# Patient Record
Sex: Female | Born: 1995 | Race: White | Hispanic: No | Marital: Married | Smoking: Never smoker
Health system: Southern US, Community
[De-identification: ages and names within clinical notes are randomized; demographics above are authoritative.]

## PROBLEM LIST (undated history)

## (undated) DIAGNOSIS — J45909 Unspecified asthma, uncomplicated: Secondary | ICD-10-CM

## (undated) DIAGNOSIS — A749 Chlamydial infection, unspecified: Secondary | ICD-10-CM

## (undated) HISTORY — PX: WISDOM TOOTH EXTRACTION: SHX21

## (undated) HISTORY — PX: NO PAST SURGERIES: SHX2092

---
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
## 2019-08-02 ENCOUNTER — Emergency Department
Admission: EM | Admit: 2019-08-02 | Discharge: 2019-08-02 | Disposition: A | Discharge status: Home / Self Care | Payer: Medicaid Other | Attending: Emergency Medicine | Admitting: Emergency Medicine

## 2019-08-02 ENCOUNTER — Other Ambulatory Visit: Payer: Self-pay

## 2019-08-02 ENCOUNTER — Encounter: Payer: Self-pay | Admitting: Emergency Medicine

## 2019-08-02 DIAGNOSIS — S3992XA Unspecified injury of lower back, initial encounter: Secondary | ICD-10-CM | POA: Diagnosis present

## 2019-08-02 DIAGNOSIS — Y999 Unspecified external cause status: Secondary | ICD-10-CM | POA: Diagnosis not present

## 2019-08-02 DIAGNOSIS — S39012A Strain of muscle, fascia and tendon of lower back, initial encounter: Secondary | ICD-10-CM | POA: Diagnosis not present

## 2019-08-02 DIAGNOSIS — Y93I9 Activity, other involving external motion: Secondary | ICD-10-CM | POA: Diagnosis not present

## 2019-08-02 DIAGNOSIS — J45909 Unspecified asthma, uncomplicated: Secondary | ICD-10-CM | POA: Insufficient documentation

## 2019-08-02 DIAGNOSIS — Y92410 Unspecified street and highway as the place of occurrence of the external cause: Secondary | ICD-10-CM | POA: Diagnosis not present

## 2019-08-02 LAB — POCT PREGNANCY, URINE: Preg Test, Ur: NEGATIVE

## 2019-08-02 MED ORDER — CYCLOBENZAPRINE HCL 5 MG PO TABS: 5.0000 mg | ORAL_TABLET | Freq: Three times a day (TID) | ORAL | 0 refills | Status: DC | PRN

## 2019-08-02 MED ORDER — IBUPROFEN 800 MG PO TABS: 800.0000 mg | ORAL_TABLET | Freq: Three times a day (TID) | ORAL | 0 refills | Status: DC | PRN
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
## 2020-02-12 ENCOUNTER — Encounter: Payer: Self-pay | Admitting: General Practice

## 2020-03-08 NOTE — L&D Delivery Note (Addendum)
Delivery Note At 8:09 AM a viable and healthy female was delivered via  (Presentation:  OA ).  APGAR: 9, 9. Baby was delivered without complication. Placenta was intact with three vessel cord.    Placenta status: Spontaneous, Intact. Cord pH: N/A  Anesthesia:  N/A Episiotomy: None Lacerations:  R labial repaired, bilateral labial tears. Suture Repair:  4.0 Est. Blood Loss (mL):  100  Mom to postpartum.  Baby to Couplet care / Skin to Skin.  Alfredo Martinez, MD, PGY1 09/13/2020, 8:31 AM   Attestation: I confirm that I have verified the information documented in the resident's note and that I have also personally reperformed the physical exam and all medical decision making activities.   I was gloved and present for entire delivery SVD without incident No difficulty with shoulders Lacerations as listed above Repair of same done by me Aviva Signs, CNM

## 2020-03-12 ENCOUNTER — Other Ambulatory Visit: Payer: Self-pay

## 2020-03-12 ENCOUNTER — Encounter: Payer: Self-pay | Admitting: Advanced Practice Midwife

## 2020-03-12 ENCOUNTER — Ambulatory Visit (INDEPENDENT_AMBULATORY_CARE_PROVIDER_SITE_OTHER): Payer: Medicaid Other | Admitting: Advanced Practice Midwife

## 2020-03-12 ENCOUNTER — Other Ambulatory Visit (HOSPITAL_COMMUNITY)
Admission: RE | Admit: 2020-03-12 | Discharge: 2020-03-12 | Disposition: A | Discharge status: Home / Self Care | Payer: Medicaid Other | Source: Ambulatory Visit | Attending: Advanced Practice Midwife | Admitting: Advanced Practice Midwife

## 2020-03-12 VITALS — BP 112/78 | HR 98 | Wt 205.0 lb

## 2020-03-12 DIAGNOSIS — Z3480 Encounter for supervision of other normal pregnancy, unspecified trimester: Secondary | ICD-10-CM | POA: Insufficient documentation

## 2020-03-12 DIAGNOSIS — Z23 Encounter for immunization: Secondary | ICD-10-CM | POA: Diagnosis not present

## 2020-03-12 DIAGNOSIS — Z3A11 11 weeks gestation of pregnancy: Secondary | ICD-10-CM | POA: Diagnosis not present

## 2020-03-12 DIAGNOSIS — O0990 Supervision of high risk pregnancy, unspecified, unspecified trimester: Secondary | ICD-10-CM | POA: Insufficient documentation

## 2020-03-12 MED ORDER — VITAFOL GUMMIES 3.33-0.333-34.8 MG PO CHEW: 1.0000 | CHEWABLE_TABLET | Freq: Every day | ORAL | 5 refills | Status: DC

## 2020-03-12 NOTE — Progress Notes (Signed)
Subjective:   Sara Wilkinson is a 25 y.o. G2P1001 at 103w6d by LMP being seen today for her first obstetrical visit.  Her obstetrical history is significant for none. Patient does intend to breast feed. Breastfed first baby 2.5 years. Pregnancy history fully reviewed.  Patient reports no complaints.  HISTORY: OB History  Gravida Para Term Preterm AB Living  2 1 1  0 0 1  SAB IAB Ectopic Multiple Live Births  0 0 0 0 1    # Outcome Date GA Lbr Len/2nd Weight Sex Delivery Anes PTL Lv  2 Current           1 Term 05/02/15 [redacted]w[redacted]d 70:31 / 00:44 6 lb 8.1 oz (2.951 kg) F Vag-Spont EPI  LIV     Name: Sara Wilkinson     Apgar1: 9  Apgar5: 9    Last pap smear was done 2016 and was normal  Past Medical History:  Diagnosis Date  . Asthma childhood   Pt has used inhaler in past/ cannot recall  . Asthma childhood   Pt still keeps inhaler available  . Asthma childhood   In case needed  . Asthma childhood   Childhood asthma  . Chlamydia    Past Surgical History:  Procedure Laterality Date  . NO PAST SURGERIES     History reviewed. No pertinent family history. Social History   Tobacco Use  . Smoking status: Never Smoker  . Smokeless tobacco: Never Used  Substance Use Topics  . Alcohol use: No  . Drug use: No   Allergies  Allergen Reactions  . Hydrocodone Hives and Other (See Comments)    Gets the "shakes"   Current Outpatient Medications on File Prior to Visit  Medication Sig Dispense Refill  . Prenatal Vit-Fe Fumarate-FA (PRENATAL MULTIVITAMIN) TABS tablet Take 1 tablet by mouth daily at 12 noon.    . cyclobenzaprine (FLEXERIL) 5 MG tablet Take 1 tablet (5 mg total) by mouth 3 (three) times daily as needed. (Patient not taking: Reported on 03/12/2020) 15 tablet 0  . ibuprofen (ADVIL) 800 MG tablet Take 1 tablet (800 mg total) by mouth every 8 (eight) hours as needed. (Patient not taking: Reported on 03/12/2020) 30 tablet 0   No current facility-administered medications on file  prior to visit.    Review of Systems Pertinent items noted in HPI and remainder of comprehensive ROS otherwise negative.  Exam   Vitals:   03/12/20 1330  BP: 112/78  Pulse: 98  Weight: 205 lb (93 kg)      Uterus:     Pelvic Exam: Perineum: no hemorrhoids, normal perineum   Vulva: normal external genitalia, no lesions   Vagina:  normal mucosa, normal discharge   Cervix: no lesions and normal, pap smear done.    Adnexa: normal adnexa and no mass, fullness, tenderness   Bony Pelvis: average  System: General: well-developed, well-nourished female in no acute distress   Breast:     Skin: normal coloration and turgor, no rashes   Neurologic: oriented, normal, negative, normal mood   Extremities: normal strength, tone, and muscle mass, ROM of all joints is normal   HEENT PERRLA, extraocular movement intact and sclera clear, anicteric   Mouth/Teeth mucous membranes moist, pharynx normal without lesions and dental hygiene good   Neck supple and no masses   Cardiovascular: regular rate and rhythm   Respiratory:  no respiratory distress, normal breath sounds   Abdomen: soft, non-tender; bowel sounds normal; no masses,  no organomegaly  Pt informed that the ultrasound is considered a limited OB ultrasound and is not intended to be a complete ultrasound exam.  Patient also informed that the ultrasound is not being completed with the intent of assessing for fetal or placental anomalies or any pelvic abnormalities.  Explained that the purpose of today's ultrasound is to assess for  viability.  Patient acknowledges the purpose of the exam and the limitations of the study.    + FCA 180 BPM with doppler  Assessment:   Pregnancy: G2P1001 Patient Active Problem List   Diagnosis Date Noted  . Supervision of other normal pregnancy, antepartum 03/12/2020  . Normal labor and delivery 05/02/2015  . NVD (normal vaginal delivery) 05/02/2015     Plan:  1. Supervision of other normal pregnancy,  antepartum - Genetic Screening - CBC/D/Plt+RPR+Rh+ABO+Rub Ab... - Culture, OB Urine - Cytology - PAP - HgB A1c - [place] MFM OB COMP + 14 WK; Future  2. [redacted] weeks gestation of pregnancy - hasn't had covid vaccine. Discussed recommended in pregnancy. Patient will get this done.   Initial labs drawn. Continue prenatal vitamins. Genetic Screening discussed, NIPS: requested. Ultrasound discussed; fetal anatomic survey: requested. Problem list reviewed and updated. The nature of [facility] with multiple MDs and other Advanced Practice Providers was explained to patient; also emphasized that residents, students are part of our team. Routine obstetric precautions reviewed. 50% of 45 min visit spent in counseling and coordination of care. Return in about 4 weeks (around 04/09/2020) for virtual visit .   Thressa Sheller DNP, CNM  03/12/20  1:45 PM

## 2020-03-12 NOTE — Patient Instructions (Signed)
COVID-19 Vaccination if You Are Pregnant or Breastfeeding ° °The Society for Maternal-Fetal Medicine (SMFM) and other pregnancy experts recommend that pregnant and lactating people be vaccinated against COVID-19. The Centers for Disease Control and Prevention (CDC) also recommend vaccination for “all people aged 24 years and older, including people who are pregnant, breastfeeding, trying to get pregnant now, or might become pregnant in the future.” Vaccination is the best way to reduce the risks of COVID-19 infection and COVID-related complications for both you and your baby. ° °Three vaccines are available to prevent COVID-19: °• The two-dose Pfizer vaccine for people 12 years and older--APPROVED by the [place] Food and Drug Administration on October 29, 2019 °• The two-dose Moderna vaccine for people 18 years and older--AUTHORIZED for emergency use °• The one-dose Johnson & Johnson vaccine for people 18 years and older (you may also see this vaccine referred to as the “Janssen vaccine”)--AUTHORIZED for emergency use ° °For those receiving the Pfizer and Moderna vaccines, the second dose is given 21 days (Pfizer) and 28 days (Moderna) after the first dose. The Johnson & Johnson vaccine is only one dose. ° °Information for Pregnant Individuals °If you are pregnant or planning to become pregnant and are thinking about getting vaccinated, consider talking with your health care professional about the vaccine.  ° °To help with your decision, you should consider the following key points: °Anyone can get the COVID vaccines free of charge regardless of immigration status or whether they have insurance. You may be asked for your social security number, but it is  °NOT required to get vaccinated. ° °What are benefits of getting the COVID-19 vaccines during pregnancy?  °• The vaccines can help protect you from getting COVID-19. With the two-dose vaccines, you must get both doses for maximum effectiveness. It’s not yet known how  long protection lasts. ° °• Another potential benefit is that getting the vaccine while pregnant may help you pass antiCOVID-19 antibodies to your baby. In numerous studies of vaccinated moms, antibodies were found in the umbilical cord blood of babies and in the mother’s breastmilk. ° °• The CDC, along with other federal partners, are monitoring people who have been vaccinated for serious side effects. So far, more than 139,000 pregnant people have been °vaccinated. No unexpected pregnancy or fetal problems have occurred. There have been no reports of any increased risk of pregnancy loss, growth problems, or birth defects. ° °• A safe vaccine is generally considered one in which the benefits of being vaccinated outweigh the risks. The current vaccines are not live vaccines. There is only a very small  °chance that they cross the placenta, so it’s unlikely that they even reach the fetus. Vaccines don’t affect future fertility. The only people who should NOT get vaccinated are those who have had a severe allergic reaction to vaccines in the past or any vaccine ingredients. ° °• Side effects may occur in the first 3 days after getting vaccinated.1 These include mild to moderate fever, headache, and muscle aches. Side effects may be worse after the second dose of the Pfizer and Moderna vaccines. Fever should be avoided during pregnancy,especially in the first trimester. Those who develop a fever after vaccination can take °acetaminophen (Tylenol). This medication is safe to use during pregnancy and does not  affect how the vaccine works.  ° °What are the known risks of getting COVID-19 during pregnancy?  °About 1 to 3 per 1,000 pregnant women with COVID-19 will develop severe disease. Compared with those who   aren’t pregnant, pregnant people infected by the COVID-19 virus: °• Are 3 times more likely to need ICU care °• Are 2 to 3 times more likely to need advanced life support and a breathing tube  °• Have a small  increased risk of dying due to COVID-19 °They may also be at increased risk of stillbirth and preterm birth. ° °What is my risk of getting COVID-19?  °Your risk of getting COVID-19 depends on the chance that you will come into contact with another infected person. The risk may be higher if you live in a community where there is a lot of COVID-19 infection or work in healthcare or another high-contact setting.  ° °What is my risk for severe complications if I get COVID-19?  °Data show that older pregnant women; those with preexisting health conditions, such as a body mass index higher than 35 kg/m2, diabetes, and heart disorders; and Black or Latinx women have an especially increased risk of severe disease and death from COVID-19. °  °If you still have questions about the vaccines or need more information, ask your health care provider or go to the Centers for Disease Control and Prevention’s COVID-19 vaccine webpage.  ° °An Update on the Johnson & Johnson Vaccine  ° °In April 2021, the FDA and CDC called for a brief pause to use of the Johnson & Johnson vaccine. They did so after reports of a severe side effect in a very small number of women younger than age 50 following vaccination. This side effect, called thrombosis with thrombocytopenia syndrome (TTS), causes blood clots (thrombosis) combined with low levels of platelets (thrombocytopenia). ° °TTS following the Johnson & Johnson vaccine is extremely rare. At the time of this update, it has occurred in only 7 people per 1 million Johnson & Johnson shots given. According to the CDC, being on hormonal birth control (the pill, patch, or ring), pregnancy, breastfeeding, or being recently pregnant does not make you more likely to develop TTS after getting the Johnson & Johnson vaccine. The pause was lifted on June 29, 2019, after the FDA and CDC determined that the known benefits of the Johnson & Johnson vaccine far outweigh the risks.  ° °Health care professionals  have been alerted to the possibility of this side effect in people who have received the Johnson & Johnson vaccine. National organizations continue to recommend COVID-19 vaccination with any of the vaccines for pregnant women. All women younger than age 50 years, whether pregnant, breastfeeding, or not, should be aware of the very rare risk of TTS after getting the Johnson & Johnson vaccine. The Pfizer and Moderna vaccines don’t have this risk. If you get the Johnson & Johnson vaccine,  °seek medical help right away if you develop any of the following symptoms within 3 weeks of getting your shot: ° °• Severe or persistent headaches or blurred vision °• Shortness of breath °• Chest pain °• Leg swelling °• Persistent abdominal pain °• Easy bruising or tiny blood spots under the skin beyond the injection site ° °Experts continue to collect health and safety information from pregnant people who have been vaccinated. If you have questions about vaccination during pregnancy, visit the CDC website or talk to your health care professional. Information for Breastfeeding/Lactating Individuals The Society for Maternal-Fetal Medicine and other pregnancy experts recommend COVID-19 vaccination for people who are breastfeeding/lactating. You don’t have to delay or stop  °breastfeeding just because you get vaccinated.  ° °Getting Vaccinated  °You can get vaccinated at   any time during pregnancy. The CDC is committed to monitoring the vaccine’s safety for all individuals. Your health professional or vaccine clinic may give you information about enrolling in the v-safe after vaccination health checker (see the box below).Even after you’re fully vaccinated, it is important to follow the CDC’s guidance for wearing a mask indoors in areas where there are substantial or high rates of COVID-19 infection.  ° °What Happens When You Enroll in v-Safe?  °The v-safe after vaccination health checker program lets the CDC check in with you after  your vaccination. At sign-up, you can indicate that you are pregnant. Once you do that, expect the following: °• Someone may call you from the v-safe program to ask initial questions and get more information. °• You may be asked to enroll in the vaccine pregnancy registry, which is collecting information about any effects of the vaccine during pregnancy. This is a great way to help scientists monitor the vaccine’s safety and effectiveness.  ° °References °1. Oliver SE, Gargano JW, Marin M, Wallace M, Curran KG, Chamberland M, et al. The Advisory Committee on Immunization Practices’ Interim Recommendation for Use of Pfizer-BioNTech  °COVID-19 Vaccine -- [place], December 2020. MMWR Morbidity and Mortality Weekly Report 2020;69. °2. FDA Briefing Document. Janssen Ad26.COV2.S Vaccine for the Prevention of COVID-19. 2021. Accessed May 11, 2019; Available from: https://www.fda.gov/media/146217/download °3. PFIZER-BIONTECH COVID-19 VACCINE [package insert] [place]: Pfizer and [place], [place]: Biontech;2020. °4. FDA Briefing Document. Moderna COVID-19 Vaccine. 2020. Accessed 2020, Dec 18; Available from: https://www.fda.gov/media/144434/download  °5. Gray KJ, Bordt EA, Atyeo C, Deriso E, Akinwunmi B, Young N, et al. COVID-19 vaccine response in pregnant and lactating women: a cohort study. Am J Obstet Gynecol 2021 Mar 24. °6. Panagiotakopoulos L, Myers TR, Gee J, Lipkind HS, Kharbanda EO, Ryan DS, et al. SARS-CoV-2 Infection Among Hospitalized Pregnant Women: Reasons for Admission and Pregnancy  °Characteristics - [facility], March 1-Aug 05, 2018. MMWR Morb Mortal Wkly Rep 2020 Sep 23;69(38):1355-9. °7. Zambrano LD, Ellington S, Strid P, Galang RR, Oduyebo T, Tong VT, et al. Update: Characteristics of Symptomatic Women of Reproductive Age with Laboratory-Confirmed SARSCoV-2 Infection by Pregnancy Status - [place], January 22-December 09, 2018. MMWR Morb Mortal Wkly Rep 2020 Nov  6;69(44):1641-7. °8. Delahoy MJ, Whitaker M, O'Halloran A, Chai SJ, Kirley PD, Alden N, et al. Characteristics and Maternal and Birth Outcomes of Hospitalized Pregnant Women with Laboratory-Confirmed COVID-19 - COVID-NET, 13 States, March 1-October 28, 2018. MMWR Morb Mortal Wkly Rep 2020 Sep 25;69(38):1347-54. ° °

## 2020-03-13 ENCOUNTER — Telehealth: Payer: Self-pay

## 2020-03-13 LAB — CBC/D/PLT+RPR+RH+ABO+RUB AB...
Antibody Screen: NEGATIVE
Basophils Absolute: 0 10*3/uL (ref 0.0–0.2)
Basos: 0 %
EOS (ABSOLUTE): 0.1 10*3/uL (ref 0.0–0.4)
Eos: 1 %
HCV Ab: <0.1 s/co ratio (ref 0.0–0.9)
HIV Screen 4th Generation wRfx: NONREACTIVE
Hematocrit: 40.3 % (ref 34.0–46.6)
Hemoglobin: 14.1 g/dL (ref 11.1–15.9)
Hepatitis B Surface Ag: NEGATIVE
Immature Grans (Abs): 0 10*3/uL (ref 0.0–0.1)
Immature Granulocytes: 0 %
Lymphocytes Absolute: 2.2 10*3/uL (ref 0.7–3.1)
Lymphs: 23 %
MCH: 30.9 pg (ref 26.6–33.0)
MCHC: 35 g/dL (ref 31.5–35.7)
MCV: 88 fL (ref 79–97)
Monocytes Absolute: 0.4 10*3/uL (ref 0.1–0.9)
Monocytes: 4 %
Neutrophils Absolute: 6.9 10*3/uL (ref 1.4–7.0)
Neutrophils: 72 %
Platelets: 213 10*3/uL (ref 150–450)
RBC: 4.56 x10E6/uL (ref 3.77–5.28)
RDW: 11.8 % (ref 11.7–15.4)
RPR Ser Ql: NONREACTIVE
Rh Factor: POSITIVE
Rubella Antibodies, IGG: 1.91 index (ref >0.99)
WBC: 9.6 10*3/uL (ref 3.4–10.8)

## 2020-03-13 LAB — HEMOGLOBIN A1C
Est. average glucose Bld gHb Est-mCnc: 85 mg/dL
Hgb A1c MFr Bld: 4.6 % — ABNORMAL LOW (ref 4.8–5.6)

## 2020-03-13 LAB — HCV INTERPRETATION

## 2020-03-13 NOTE — Telephone Encounter (Signed)
Called patient in regards to MFM appt in [place]. Made sure pt, wrote down the appt time and date with is on Feb 8,2022 at 11:00am but to arrive at 10:45.  Sherol Dade

## 2020-03-14 LAB — URINE CULTURE, OB REFLEX

## 2020-03-14 LAB — CULTURE, OB URINE

## 2020-03-17 LAB — CYTOLOGY - PAP
Chlamydia: NEGATIVE
Comment: NEGATIVE
Comment: NEGATIVE
Comment: NORMAL
Diagnosis: NEGATIVE
Neisseria Gonorrhea: NEGATIVE
Trichomonas: NEGATIVE

## 2020-03-31 ENCOUNTER — Encounter: Payer: Self-pay | Admitting: *Deleted

## 2020-04-09 ENCOUNTER — Other Ambulatory Visit: Payer: Self-pay

## 2020-04-09 ENCOUNTER — Encounter: Payer: Self-pay | Admitting: Obstetrics and Gynecology

## 2020-04-09 ENCOUNTER — Ambulatory Visit (INDEPENDENT_AMBULATORY_CARE_PROVIDER_SITE_OTHER): Payer: Medicaid Other | Admitting: Obstetrics and Gynecology

## 2020-04-09 VITALS — BP 132/81 | HR 111 | Temp 97.9°F | Wt 200.0 lb

## 2020-04-09 DIAGNOSIS — Z3A15 15 weeks gestation of pregnancy: Secondary | ICD-10-CM

## 2020-04-09 DIAGNOSIS — Z3480 Encounter for supervision of other normal pregnancy, unspecified trimester: Secondary | ICD-10-CM

## 2020-04-09 MED ORDER — GOJJI WEIGHT SCALE MISC: 1.0000 | Freq: Every day | 0 refills | Status: DC | PRN

## 2020-04-09 MED ORDER — BLOOD PRESSURE MONITOR AUTOMAT DEVI: 1.0000 | Freq: Every day | 0 refills | Status: DC

## 2020-04-09 NOTE — Progress Notes (Signed)
   LOW-RISK PREGNANCY OFFICE VISIT Patient name: Sara Wilkinson MRN [id number]  Date of birth: 01-14-1996 Chief Complaint:   Routine Prenatal Visit  History of Present Illness:   Sara Wilkinson is a 25 y.o. G34P1001 female at [redacted]w[redacted]d with an Estimated Date of Delivery: 09/25/20 being seen today for ongoing management of a low-risk pregnancy.  Today she reports no complaints and she started feeling flutters about 1 week ago. Contractions: Not present. Vag. Bleeding: None.  Movement: Present. denies leaking of fluid. Review of Systems:   Pertinent items are noted in HPI Denies abnormal vaginal discharge w/ itching/odor/irritation, headaches, visual changes, shortness of breath, chest pain, abdominal pain, severe nausea/vomiting, or problems with urination or bowel movements unless otherwise stated above. Pertinent History Reviewed:  Reviewed past medical,surgical, social, obstetrical and family history.  Reviewed problem list, medications and allergies. Physical Assessment:   Vitals:   04/09/20 1319  BP: 132/81  Pulse: (!) 111  Temp: 97.9 F (36.6 C)  Weight: 200 lb (90.7 kg)  Body mass index is 30.41 kg/m.        Physical Examination:   General appearance: Well appearing, and in no distress  Mental status: Alert, oriented to person, place, and time  Skin: Warm & dry  Cardiovascular: Normal heart rate noted  Respiratory: Normal respiratory effort, no distress  Abdomen: Soft, gravid, nontender  Pelvic: Cervical exam deferred         Extremities: Edema: None  Fetal Status: Fetal Heart Rate (bpm): 160   Movement: Present   FH: S=D    Assessment & Plan:  1) Low-risk pregnancy G2P1001 at [redacted]w[redacted]d with an Estimated Date of Delivery: 09/25/20   2) Supervision of other normal pregnancy, antepartum  - Blood Pressure Monitoring (BLOOD PRESSURE MONITOR AUTOMAT) DEVI,  - Misc. Devices (GOJJI WEIGHT SCALE) MISC,  - AFP, Serum, Open Spina Bifida - Anticipatory guidance for upcoming My Chart  visits - Explained that she will need to go to [facility] to pick up her BP cuff and scale  3) [redacted] weeks gestation of pregnancy   Meds: No orders of the defined types were placed in this encounter.  Labs/procedures today: AFP  Plan:  Continue routine obstetrical care   Reviewed: Preterm labor symptoms and general obstetric precautions including but not limited to vaginal bleeding, contractions, leaking of fluid and fetal movement were reviewed in detail with the patient.  All questions were answered. Does not home bp cuff. Rx faxed to [facility]. Check bp weekly, let us know if >140/90.   Follow-up: Return in about 4 weeks (around 05/07/2020) for Return OB - My Chart video.  No orders of the defined types were placed in this encounter.  Raelyn Mora MSN, CNM 04/09/2020 1:23 PM

## 2020-04-09 NOTE — Patient Instructions (Signed)
Alpha-Fetoprotein Test Why am I having this test? The alpha-fetoprotein test is a lab test most commonly used for pregnant women to help screen for birth defects in their unborn baby. It can be used to screen for chromosome (DNA) abnormalities, problems with the brain or spinal cord, or problems with the abdominal wall of the unborn baby (fetus). The alpha-fetoprotein test may also be done for men or nonpregnant women to check for certain cancers. What is being tested? This test measures the amount of alpha-fetoprotein (AFP) in your blood. AFP is a protein that is made by the liver. Levels can be detected in the mother's blood during pregnancy, starting at 10 weeks and peaking at 16-18 weeks of the pregnancy. Abnormal levels can sometimes be a sign of a birth defect in the baby. Certain cancers can cause a high level of AFP in men and nonpregnant women. What kind of sample is taken? A blood sample is required for this test. It is usually collected by inserting a needle into a blood vessel.   How are the results reported? Your test results will be reported as values. Your health care provider will compare your results to normal ranges that were established after testing a large group of people (reference values). Reference values may vary among labs and hospitals. For this test, common reference values are:  Adult: Less than 40 ng/mL or less than 40 mcg/L (SI units).  Child younger than 1 year: Less than 30 ng/mL. If you are pregnant, the values may also vary based on how long you have been pregnant. What do the results mean? Results that are above the reference values in pregnant women may indicate the following for the baby:  Neural tube defects, such as abnormalities of the spinal cord or brain.  Abdominal wall defects.  Multiple pregnancy such as twins.  Fetal distress or fetal death. Results that are above the reference values in men or nonpregnant women may indicate:  Reproductive  cancers, such as ovarian or testicular cancer.  Liver cancer.  Liver cell death.  Other types of cancer. Very low levels of AFP in pregnant women may indicate Down syndrome for the baby. Talk with your health care provider about what your results mean. Questions to ask your health care provider Ask your health care provider, or the department that is doing the test:  When will my results be ready?  How will I get my results?  What are my treatment options?  What other tests do I need?  What are my next steps? Summary  The alpha-fetoprotein test is done on pregnant women to help screen for birth defects in their unborn baby.  Certain cancers can cause a high level of AFP in men and nonpregnant women.  For this test, a blood sample is usually collected by inserting a needle into a blood vessel.  Talk with your health care provider about what your results mean. This information is not intended to replace advice given to you by your health care provider. Make sure you discuss any questions you have with your health care provider. Document Revised: 09/14/2019 Document Reviewed: 09/14/2019 Elsevier Patient Education  2021 Elsevier Inc.  

## 2020-04-11 LAB — AFP, SERUM, OPEN SPINA BIFIDA
AFP MoM: 1.18
AFP Value: 29.3 ng/mL
Gest. Age on Collection Date: 15 weeks
Maternal Age At EDD: 24 yr
OSBR Risk 1 IN: 6916
Test Results:: NEGATIVE
Weight: 200 [lb_av]

## 2020-04-15 ENCOUNTER — Other Ambulatory Visit: Payer: Self-pay

## 2020-04-15 ENCOUNTER — Ambulatory Visit: Payer: Medicaid Other | Attending: Advanced Practice Midwife

## 2020-04-15 DIAGNOSIS — Z3A16 16 weeks gestation of pregnancy: Secondary | ICD-10-CM | POA: Insufficient documentation

## 2020-04-15 DIAGNOSIS — Z3689 Encounter for other specified antenatal screening: Secondary | ICD-10-CM | POA: Diagnosis not present

## 2020-04-15 DIAGNOSIS — Z3481 Encounter for supervision of other normal pregnancy, first trimester: Secondary | ICD-10-CM | POA: Insufficient documentation

## 2020-04-15 DIAGNOSIS — Z3480 Encounter for supervision of other normal pregnancy, unspecified trimester: Secondary | ICD-10-CM

## 2020-05-01 ENCOUNTER — Other Ambulatory Visit: Payer: Self-pay | Admitting: Obstetrics and Gynecology

## 2020-05-01 DIAGNOSIS — O359XX0 Maternal care for (suspected) fetal abnormality and damage, unspecified, not applicable or unspecified: Secondary | ICD-10-CM

## 2020-05-06 ENCOUNTER — Other Ambulatory Visit: Payer: Self-pay

## 2020-05-06 ENCOUNTER — Ambulatory Visit: Payer: Medicaid Other | Attending: Maternal & Fetal Medicine

## 2020-05-06 DIAGNOSIS — O359XX0 Maternal care for (suspected) fetal abnormality and damage, unspecified, not applicable or unspecified: Secondary | ICD-10-CM | POA: Diagnosis not present

## 2020-05-06 DIAGNOSIS — O99212 Obesity complicating pregnancy, second trimester: Secondary | ICD-10-CM | POA: Diagnosis not present

## 2020-05-06 DIAGNOSIS — Z3A19 19 weeks gestation of pregnancy: Secondary | ICD-10-CM

## 2020-05-06 DIAGNOSIS — Z3689 Encounter for other specified antenatal screening: Secondary | ICD-10-CM | POA: Insufficient documentation

## 2020-05-06 DIAGNOSIS — Z369 Encounter for antenatal screening, unspecified: Secondary | ICD-10-CM | POA: Diagnosis present

## 2020-05-07 ENCOUNTER — Encounter: Payer: Self-pay | Admitting: Advanced Practice Midwife

## 2020-05-07 ENCOUNTER — Telehealth (INDEPENDENT_AMBULATORY_CARE_PROVIDER_SITE_OTHER): Payer: Medicaid Other | Admitting: Advanced Practice Midwife

## 2020-05-07 VITALS — BP 119/79 | HR 91 | Wt 194.7 lb

## 2020-05-07 DIAGNOSIS — Z3482 Encounter for supervision of other normal pregnancy, second trimester: Secondary | ICD-10-CM

## 2020-05-07 DIAGNOSIS — Z3A19 19 weeks gestation of pregnancy: Secondary | ICD-10-CM

## 2020-05-07 DIAGNOSIS — Z3480 Encounter for supervision of other normal pregnancy, unspecified trimester: Secondary | ICD-10-CM

## 2020-05-07 NOTE — Progress Notes (Signed)
I connected with Sara Wilkinson 05/07/20 at  1:10 PM EST by: MyChart video and verified that I am speaking with the correct person using two identifiers.  Patient is located at home and provider is located at [place] .     The purpose of this virtual visit is to provide medical care while limiting exposure to the novel coronavirus. I discussed the limitations, risks, security and privacy concerns of performing an evaluation and management service by MyChart video and the availability of in person appointments. I also discussed with the patient that there may be a patient responsible charge related to this service. By engaging in this virtual visit, you consent to the provision of healthcare.  Additionally, you authorize for your insurance to be billed for the services provided during this visit.  The patient expressed understanding and agreed to proceed.  The following staff members participated in the virtual visit:  Dorisann Frames, RN    PRENATAL VISIT NOTE  Subjective:  Sara Wilkinson is a 25 y.o. G2P1001 at [redacted]w[redacted]d  for phone visit for ongoing prenatal care.  She is currently monitored for the following issues for this low-risk pregnancy and has Normal labor and delivery; NVD (normal vaginal delivery); and Supervision of other normal pregnancy, antepartum on their problem list.  Patient reports no complaints.  Contractions: Not present. Vag. Bleeding: None.  Movement: Present. Denies leaking of fluid.   The following portions of the patient's history were reviewed and updated as appropriate: allergies, current medications, past family history, past medical history, past social history, past surgical history and problem list.   Objective:   Vitals:   05/07/20 1314  BP: 119/79  Pulse: 91  Weight: 194 lb 11.2 oz (88.3 kg)   Self-Obtained  Fetal Status:     Movement: Present     Assessment and Plan:  Pregnancy: G2P1001 at [redacted]w[redacted]d 1. Supervision of other normal pregnancy,  antepartum - routine care  2. [redacted] weeks gestation of pregnancy - CPC on [place], plan to FU at 32 weeks   Preterm labor symptoms and general obstetric precautions including but not limited to vaginal bleeding, contractions, leaking of fluid and fetal movement were reviewed in detail with the patient.  Return in about 4 weeks (around 06/04/2020).  Future Appointments  Date Time Provider Department Center  07/29/2020 10:00 AM [facility]     Time spent on virtual visit: 12 minutes  Thressa Sheller, CNM

## 2020-06-04 ENCOUNTER — Telehealth (INDEPENDENT_AMBULATORY_CARE_PROVIDER_SITE_OTHER): Payer: Medicaid Other | Admitting: Certified Nurse Midwife

## 2020-06-04 VITALS — BP 136/74 | HR 88 | Wt 202.0 lb

## 2020-06-04 DIAGNOSIS — Z3482 Encounter for supervision of other normal pregnancy, second trimester: Secondary | ICD-10-CM

## 2020-06-04 DIAGNOSIS — Z3A23 23 weeks gestation of pregnancy: Secondary | ICD-10-CM

## 2020-06-04 NOTE — Progress Notes (Signed)
OBSTETRICS PRENATAL VIRTUAL VISIT ENCOUNTER NOTE  Provider location: [facility]   Patient location: Home  I connected with Sara Wilkinson on 06/04/20 at  1:10 PM EDT by MyChart Video Encounter and verified that I am speaking with the correct person using two identifiers.   I discussed the limitations, risks, security and privacy concerns of performing an evaluation and management service virtually and the availability of in person appointments. I also discussed with the patient that there may be a patient responsible charge related to this service. The patient expressed understanding and agreed to proceed. Subjective:  Sara Wilkinson is a 25 y.o. G2P1001 at [redacted]w[redacted]d being seen today for ongoing prenatal care.  She is currently monitored for the following issues for this low-risk pregnancy and has Normal labor and delivery; NVD (normal vaginal delivery); and Supervision of other normal pregnancy, antepartum on their problem list.  Patient reports no complaints.  Contractions: Not present. Vag. Bleeding: None.  Movement: Present. Denies any leaking of fluid.   The following portions of the patient's history were reviewed and updated as appropriate: allergies, current medications, past family history, past medical history, past social history, past surgical history and problem list.   Objective:   Vitals:   06/04/20 1315  BP: 136/74  Pulse: 88  Weight: 202 lb (91.6 kg)    Fetal Status:     Movement: Present     General:  Alert, oriented and cooperative. Patient is in no acute distress.  Respiratory: Normal respiratory effort, no problems with respiration noted  Mental Status: Normal mood and affect. Normal behavior. Normal judgment and thought content.  Rest of physical exam deferred due to type of encounter  Imaging: [place] MFM OB FOLLOW UP  Result Date: 05/06/2020 ----------------------------------------------------------------------  OBSTETRICS  REPORT                       (Signed Final 05/06/2020 01:51 pm) ---------------------------------------------------------------------- Patient Info  ID #:       [id number]                          D.O.B.:  [birth year] (25 yrs)  Name:       Sara Wilkinson               Visit Date: 05/06/2020 01:42 pm ---------------------------------------------------------------------- Performed By  Attending:        Lin Landsman      Ref. Address:     [address]                    MD                                                             [address]                                                             [place], [place]  [address]  Performed By:     Neita Carp RDMS        [facility]                                                             [facility]  Referred By:      Raelyn Mora CNM ---------------------------------------------------------------------- Orders  #  Description                           Code        Ordered By  1  [place] MFM OB FOLLOW UP                   09323.55    ROLITTA DAWSON ----------------------------------------------------------------------  #  Order #                     Accession #                Episode #  1  [id number]                   [id number]                 [id number] ---------------------------------------------------------------------- Indications  [redacted] weeks gestation of pregnancy                Z3A.19  Obesity complicating pregnancy, second         O99.212  trimester ---------------------------------------------------------------------- Fetal Evaluation  Num Of Fetuses:         1  Fetal Heart Rate(bpm):  145  Cardiac Activity:       Observed  Presentation:           Cephalic  Placenta:               Posterior  P. Cord Insertion:      Previously Visualized                              Largest Pocket(cm)                               3.8 ---------------------------------------------------------------------- Biometry  BPD:      46.8  mm     G. Age:  20w 1d         69  %    CI:        75.73   %    70 - 86  FL/HC:      17.7   %    16.8 - 19.8  HC:      170.5  mm     G. Age:  19w 5d         39  %    HC/AC:      1.21        1.09 - 1.39  AC:      141.1  mm     G. Age:  19w 3d         37  %    FL/BPD:     64.3   %  FL:       30.1  mm     G. Age:  19w 2d         28  %    FL/AC:      21.3   %    20 - 24  CER:      19.2  mm     G. Age:  18w 5d         30  %  NFT:       2.9  mm  LV:        5.5  mm  CM:        4.4  mm  Est. FW:     293  gm    0 lb 10 oz      30  % ---------------------------------------------------------------------- Gestational Age  LMP:           19w 5d        Date:  12/20/19                 EDD:   09/25/20  U/S Today:     19w 5d                                        EDD:   09/25/20  Best:          19w 5d     Det. By:  LMP  (12/20/19)          EDD:   09/25/20 ---------------------------------------------------------------------- Anatomy  Cranium:               Appears normal         LVOT:                   Appears normal  Cavum:                 Appears normal         Aortic Arch:            Appears normal  Ventricles:            Appears normal         Ductal Arch:            Not well visualized  Choroid Plexus:        Left choroid           Diaphragm:              Previously seen                         plexus cyst  Cerebellum:            Appears normal  Stomach:                Appears normal, left                                                                        sided  Posterior Fossa:       Appears normal         Abdomen:                Previously seen  Nuchal Fold:           Appears normal         Abdominal Wall:         Appears nml (cord                                                                        insert, abd wall)  Face:                   Appears normal         Cord Vessels:           Appears normal (3                         (orbits and profile)                           vessel cord)  Lips:                  Appears normal         Kidneys:                Appear normal  Palate:                Not well visualized    Bladder:                Appears normal  Thoracic:              Appears normal         Spine:                  Appears normal  Heart:                 Appears normal         Upper Extremities:      Previously seen                         (4CH, axis, and                         situs)  RVOT:                  Appears normal         Lower Extremities:      Previously seen ---------------------------------------------------------------------- Cervix  Uterus Adnexa  Cervix  Length:           3.61  cm. ---------------------------------------------------------------------- Impression  Follow up growth due to complete the fetal anatomy and due  to maternal elevated BMI  Normal interval growth with measurements consistent with  dates  Good fetal movement and amniotic fluid volume  CPC persist however, she has a low risk NIPS result and  there are no additional markers of aneuploidy. ---------------------------------------------------------------------- Recommendations  Follow up growth at 32 weeks. ----------------------------------------------------------------------               Lin Landsman, MD Electronically Signed Final Report   05/06/2020 01:51 pm ----------------------------------------------------------------------   Assessment and Plan:  Pregnancy: G2P1001 at [redacted]w[redacted]d 1. Encounter for supervision of other normal pregnancy in second trimester - Pt doing well, feeling vigorous fetal movement, nausea has abated  2. [redacted] weeks gestation of pregnancy - Anticipatory guidance re GTT at next visit - Reassurance given re choroid plexus cyst seen on U/S, pt will have f/u at 32 weeks  Preterm labor symptoms and general obstetric  precautions including but not limited to vaginal bleeding, contractions, leaking of fluid and fetal movement were reviewed in detail with the patient. I discussed the assessment and treatment plan with the patient. The patient was provided an opportunity to ask questions and all were answered. The patient agreed with the plan and demonstrated an understanding of the instructions. The patient was advised to call back or seek an in-person office evaluation/go to [facility] at [facility] for any urgent or concerning symptoms. Please refer to After Visit Summary for other counseling recommendations.   I provided 10 minutes of face-to-face time during this encounter.  Return in about 4 weeks (around 07/02/2020) for IN-PERSON, LOB w GTT.  Future Appointments  Date Time Provider Department Center  07/29/2020 10:00 AM [facility]    Bernerd Limbo, CNM [facility] [employer]

## 2020-06-04 NOTE — Patient Instructions (Signed)
Oral Glucose Tolerance Test During Pregnancy Why am I having this test? The oral glucose tolerance test (OGTT) is done to check how your body processes blood sugar (glucose). This is one of several tests used to diagnose diabetes that develops during pregnancy (gestational diabetes mellitus). Gestational diabetes is a short-term form of diabetes that some women develop while they are pregnant. It usually occurs during the second trimester of pregnancy and goes away after delivery. Testing, or screening, for gestational diabetes usually occurs at weeks 24-28 of pregnancy. You may have the OGTT test after having a 1-hour glucose screening test if the results from that test indicate that you may have gestational diabetes. This test may also be needed if:  You have a history of gestational diabetes.  There is a history of giving birth to very large babies or of losing pregnancies (having stillbirths).  You have signs and symptoms of diabetes, such as: ? Changes in your eyesight. ? Tingling or numbness in your hands or feet. ? Changes in hunger, thirst, and urination, and these are not explained by your pregnancy. What is being tested? This test measures the amount of glucose in your blood at different times during a period of 3 hours. This shows how well your body can process glucose. What kind of sample is taken? Blood samples are required for this test. They are usually collected by inserting a needle into a blood vessel.   How do I prepare for this test?  For 3 days before your test, eat normally. Have plenty of carbohydrate-rich foods.  Follow instructions from your health care provider about: ? Eating or drinking restrictions on the day of the test. You may be asked not to eat or drink anything other than water (to fast) starting 8-10 hours before the test. ? Changing or stopping your regular medicines. Some medicines may interfere with this test. Tell a health care provider about:  All  medicines you are taking, including vitamins, herbs, eye drops, creams, and over-the-counter medicines.  Any blood disorders you have.  Any surgeries you have had.  Any medical conditions you have. What happens during the test? First, your blood glucose will be measured. This is referred to as your fasting blood glucose because you fasted before the test. Then, you will drink a glucose solution that contains a certain amount of glucose. Your blood glucose will be measured again 1, 2, and 3 hours after you drink the solution. This test takes about 3 hours to complete. You will need to stay at the testing location during this time. During the testing period:  Do not eat or drink anything other than the glucose solution.  Do not exercise.  Do not use any products that contain nicotine or tobacco, such as cigarettes, e-cigarettes, and chewing tobacco. These can affect your test results. If you need help quitting, ask your health care provider. The testing procedure may vary among health care providers and hospitals. How are the results reported? Your results will be reported as milligrams of glucose per deciliter of blood (mg/dL) or millimoles per liter (mmol/L). There is more than one source for screening and diagnosis reference values used to diagnose gestational diabetes. Your health care provider will compare your results to normal values that were established after testing a large group of people (reference values). Reference values may vary among labs and hospitals. For this test (Carpenter-Coustan), reference values are:  Fasting: 95 mg/dL (5.3 mmol/L).  1 hour: 180 mg/dL (10.0 mmol/L).  2 hour:   155 mg/dL (8.6 mmol/L).  3 hour: 140 mg/dL (7.8 mmol/L). What do the results mean? Results below the reference values are considered normal. If two or more of your blood glucose levels are at or above the reference values, you may be diagnosed with gestational diabetes. If only one level is  high, your health care provider may suggest repeat testing or other tests to confirm a diagnosis. Talk with your health care provider about what your results mean. Questions to ask your health care provider Ask your health care provider, or the department that is doing the test:  When will my results be ready?  How will I get my results?  What are my treatment options?  What other tests do I need?  What are my next steps? Summary  The oral glucose tolerance test (OGTT) is one of several tests used to diagnose diabetes that develops during pregnancy (gestational diabetes mellitus). Gestational diabetes is a short-term form of diabetes that some women develop while they are pregnant.  You may have the OGTT test after having a 1-hour glucose screening test if the results from that test show that you may have gestational diabetes. You may also have this test if you have any symptoms or risk factors for this type of diabetes.  Talk with your health care provider about what your results mean. This information is not intended to replace advice given to you by your health care provider. Make sure you discuss any questions you have with your health care provider. Document Revised: 08/02/2019 Document Reviewed: 08/02/2019 Elsevier Patient Education  2021 [employer]  

## 2020-07-02 ENCOUNTER — Encounter: Payer: Self-pay | Admitting: Obstetrics and Gynecology

## 2020-07-02 ENCOUNTER — Ambulatory Visit (INDEPENDENT_AMBULATORY_CARE_PROVIDER_SITE_OTHER): Payer: Medicaid Other | Admitting: Obstetrics and Gynecology

## 2020-07-02 ENCOUNTER — Other Ambulatory Visit: Payer: Self-pay

## 2020-07-02 VITALS — BP 131/85 | HR 114 | Temp 97.6°F | Wt 202.6 lb

## 2020-07-02 DIAGNOSIS — Z23 Encounter for immunization: Secondary | ICD-10-CM | POA: Diagnosis not present

## 2020-07-02 DIAGNOSIS — O26893 Other specified pregnancy related conditions, third trimester: Secondary | ICD-10-CM

## 2020-07-02 DIAGNOSIS — Z3480 Encounter for supervision of other normal pregnancy, unspecified trimester: Secondary | ICD-10-CM

## 2020-07-02 DIAGNOSIS — Z3A27 27 weeks gestation of pregnancy: Secondary | ICD-10-CM

## 2020-07-02 DIAGNOSIS — R102 Pelvic and perineal pain: Secondary | ICD-10-CM

## 2020-07-02 DIAGNOSIS — O210 Mild hyperemesis gravidarum: Secondary | ICD-10-CM

## 2020-07-02 DIAGNOSIS — O163 Unspecified maternal hypertension, third trimester: Secondary | ICD-10-CM

## 2020-07-02 MED ORDER — COMFORT FIT MATERNITY SUPP SM MISC: 1.0000 [IU] | Freq: Every day | 0 refills | Status: DC | PRN

## 2020-07-02 MED ORDER — METOCLOPRAMIDE HCL 10 MG PO TABS: 10.0000 mg | ORAL_TABLET | Freq: Four times a day (QID) | ORAL | 0 refills | Status: DC

## 2020-07-02 NOTE — Patient Instructions (Addendum)
Fetal Movement Counts Patient Name: ________________________________________________ Patient Due Date: ____________________  What is a fetal movement count? A fetal movement count is the number of times that you feel your baby move during a certain amount of time. This may also be called a fetal kick count. A fetal movement count is recommended for every pregnant woman. You may be asked to start counting fetal movements as early as week 28 of your pregnancy. Pay attention to when your baby is most active. You may notice your baby's sleep and wake cycles. You may also notice things that make your baby move more. You should do a fetal movement count:  When your baby is normally most active.  At the same time each day. A good time to count movements is while you are resting, after having something to eat and drink. How do I count fetal movements? 1. Find a quiet, comfortable area. Sit, or lie down on your side. 2. Write down the date, the start time and stop time, and the number of movements that you felt between those two times. Take this information with you to your health care visits. 3. Write down your start time when you feel the first movement. 4. Count kicks, flutters, swishes, rolls, and jabs. You should feel at least 10 movements. 5. You may stop counting after you have felt 10 movements, or if you have been counting for 2 hours. Write down the stop time. 6. If you do not feel 10 movements in 2 hours, contact your health care provider for further instructions. Your health care provider may want to do additional tests to assess your baby's well-being. Contact a health care provider if:  You feel fewer than 10 movements in 2 hours.  Your baby is not moving like he or she usually does. Date: ____________ Start time: ____________ Stop time: ____________ Movements: ____________ Date: ____________ Start time: ____________ Stop time: ____________ Movements: ____________ Date: ____________  Start time: ____________ Stop time: ____________ Movements: ____________ Date: ____________ Start time: ____________ Stop time: ____________ Movements: ____________ Date: ____________ Start time: ____________ Stop time: ____________ Movements: ____________ Date: ____________ Start time: ____________ Stop time: ____________ Movements: ____________ Date: ____________ Start time: ____________ Stop time: ____________ Movements: ____________ Date: ____________ Start time: ____________ Stop time: ____________ Movements: ____________ Date: ____________ Start time: ____________ Stop time: ____________ Movements: ____________ This information is not intended to replace advice given to you by your health care provider. Make sure you discuss any questions you have with your health care provider. Document Revised: 10/12/2018 Document Reviewed: 10/12/2018 Elsevier Patient Education  2021 Elsevier Inc.   Iron-Rich Diet  Iron is a mineral that helps your body to produce hemoglobin. Hemoglobin is a protein in red blood cells that carries oxygen to your body's tissues. Eating too little iron may cause you to feel weak and tired, and it can increase your risk of infection. Iron is naturally found in many foods, and many foods have iron added to them (iron-fortified foods). You may need to follow an iron-rich diet if you do not have enough iron in your body due to certain medical conditions. The amount of iron that you need each day depends on your age, your sex, and any medical conditions you have. Follow instructions from your health care provider or a diet and nutrition specialist (dietitian) about how much iron you should eat each day. What are tips for following this plan? Reading food labels  Check food labels to see how many milligrams (mg) of iron are  in each serving. Cooking  Cook foods in pots and pans that are made from iron.  Take these steps to make it easier for your body to absorb iron from  certain foods: ? Soak beans overnight before cooking. ? Soak whole grains overnight and drain them before using. ? Ferment flours before baking, such as by using yeast in bread dough. Meal planning  When you eat foods that contain iron, you should eat them with foods that are high in vitamin C. These include oranges, peppers, tomatoes, potatoes, and mango. Vitamin C helps your body to absorb iron. General information  Take iron supplements only as told by your health care provider. An overdose of iron can be life-threatening. If you were prescribed iron supplements, take them with orange juice or a vitamin C supplement.  When you eat iron-fortified foods or take an iron supplement, you should also eat foods that naturally contain iron, such as meat, poultry, and fish. Eating naturally iron-rich foods helps your body to absorb the iron that is added to other foods or contained in a supplement.  Certain foods and drinks prevent your body from absorbing iron properly. Avoid eating these foods in the same meal as iron-rich foods or with iron supplements. These foods include: ? Coffee, black tea, and red wine. ? Milk, dairy products, and foods that are high in calcium. ? Beans and soybeans. ? Whole grains. What foods should I eat? Fruits Prunes. Raisins. Eat fruits high in vitamin C, such as oranges, grapefruits, and strawberries, alongside iron-rich foods. Vegetables Spinach (cooked). Green peas. Broccoli. Fermented vegetables. Eat vegetables high in vitamin C, such as leafy greens, potatoes, bell peppers, and tomatoes, alongside iron-rich foods. Grains Iron-fortified breakfast cereal. Iron-fortified whole-wheat bread. Enriched rice. Sprouted grains. Meats and other proteins Beef liver. Oysters. Beef. Shrimp. [place]. Chicken. Tuna. Sardines. Chickpeas. Nuts. Tofu. Pumpkin seeds. Beverages Tomato juice. Fresh orange juice. Prune juice. Hibiscus tea. Fortified instant breakfast  shakes. Sweets and desserts Blackstrap molasses. Seasonings and condiments Tahini. Fermented soy sauce. Other foods Wheat germ. The items listed above may not be a complete list of recommended foods and beverages. Contact a dietitian for more information. What foods should I avoid? Grains Whole grains. Bran cereal. Bran flour. Oats. Meats and other proteins Soybeans. Products made from soy protein. Black beans. Lentils. Mung beans. Split peas. Dairy Milk. Cream. Cheese. Yogurt. Cottage cheese. Beverages Coffee. Black tea. Red wine. Sweets and desserts Cocoa. Chocolate. Ice cream. Other foods Basil. Oregano. Large amounts of parsley. The items listed above may not be a complete list of foods and beverages to avoid. Contact a dietitian for more information. Summary  Iron is a mineral that helps your body to produce hemoglobin. Hemoglobin is a protein in red blood cells that carries oxygen to your body's tissues.  Iron is naturally found in many foods, and many foods have iron added to them (iron-fortified foods).  When you eat foods that contain iron, you should eat them with foods that are high in vitamin C. Vitamin C helps your body to absorb iron.  Certain foods and drinks prevent your body from absorbing iron properly, such as whole grains and dairy products. You should avoid eating these foods in the same meal as iron-rich foods or with iron supplements. This information is not intended to replace advice given to you by your health care provider. Make sure you discuss any questions you have with your health care provider. Document Revised: 02/04/2017 Document Reviewed: 01/18/2017 Elsevier Patient Education  2021 Elsevier Inc.   [place]host.czhttps://www.nichd.nih.gov/health/topics/labor-delivery/Pages/default.aspx">  Third Trimester of Pregnancy  The third trimester of pregnancy is from week 28 through week 40. This is months 7 through 9. The third trimester is a time when the unborn  baby (fetus) is growing rapidly. At the end of the ninth month, the fetus is about 20 inches long and weighs 6-10 pounds. Body changes during your third trimester During the third trimester, your body will continue to go through many changes. The changes vary and generally return to normal after your baby is born. Physical changes  Your weight will continue to increase. You can expect to gain 25-35 pounds (11-16 kg) by the end of the pregnancy if you begin pregnancy at a normal weight. If you are underweight, you can expect to gain 28-40 lb (about 13-18 kg), and if you are overweight, you can expect to gain 15-25 lb (about 7-11 kg).  You may begin to get stretch marks on your hips, abdomen, and breasts.  Your breasts will continue to grow and may hurt. A yellow fluid (colostrum) may leak from your breasts. This is the first milk you are producing for your baby.  You may have changes in your hair. These can include thickening of your hair, rapid growth, and changes in texture. Some people also have hair loss during or after pregnancy, or hair that feels dry or thin.  Your belly button may stick out.  You may notice more swelling in your hands, face, or ankles. Health changes  You may have heartburn.  You may have constipation.  You may develop hemorrhoids.  You may develop swollen, bulging veins (varicose veins) in your legs.  You may have increased body aches in the pelvis, back, or thighs. This is due to weight gain and increased hormones that are relaxing your joints.  You may have increased tingling or numbness in your hands, arms, and legs. The skin on your abdomen may also feel numb.  You may feel short of breath because of your expanding uterus. Other changes  You may urinate more often because the fetus is moving lower into your pelvis and pressing on your bladder.  You may have more problems sleeping. This may be caused by the size of your abdomen, an increased need to  urinate, and an increase in your body's metabolism.  You may notice the fetus "dropping," or moving lower in your abdomen (lightening).  You may have increased vaginal discharge.  You may notice that you have pain around your pelvic bone as your uterus distends. Follow these instructions at home: Medicines  Follow your health care provider's instructions regarding medicine use. Specific medicines may be either safe or unsafe to take during pregnancy. Do not take any medicines unless approved by your health care provider.  Take a prenatal vitamin that contains at least 600 micrograms (mcg) of folic acid. Eating and drinking  Eat a healthy diet that includes fresh fruits and vegetables, whole grains, good sources of protein such as meat, eggs, or tofu, and low-fat dairy products.  Avoid raw meat and unpasteurized juice, milk, and cheese. These carry germs that can harm you and your baby.  Eat 4 or 5 small meals rather than 3 large meals a day.  You may need to take these actions to prevent or treat constipation: ? Drink enough fluid to keep your urine pale yellow. ? Eat foods that are high in fiber, such as beans, whole grains, and fresh fruits and vegetables. ? Limit foods  that are high in fat and processed sugars, such as fried or sweet foods. Activity  Exercise only as directed by your health care provider. Most people can continue their usual exercise routine during pregnancy. Try to exercise for 30 minutes at least 5 days a week. Stop exercising if you experience contractions in the uterus.  Stop exercising if you develop pain or cramping in the lower abdomen or lower back.  Avoid heavy lifting.  Do not exercise if it is very hot or humid or if you are at a high altitude.  If you choose to, you may continue to have sex unless your health care provider tells you not to. Relieving pain and discomfort  Take frequent breaks and rest with your legs raised (elevated) if you have  leg cramps or low back pain.  Take warm sitz baths to soothe any pain or discomfort caused by hemorrhoids. Use hemorrhoid cream if your health care provider approves.  Wear a supportive bra to prevent discomfort from breast tenderness.  If you develop varicose veins: ? Wear support hose as told by your health care provider. ? Elevate your feet for 15 minutes, 3-4 times a day. ? Limit salt in your diet. Safety  Talk to your health care provider before traveling far distances.  Do not use hot tubs, steam rooms, or saunas.  Wear your seat belt at all times when driving or riding in a car.  Talk with your health care provider if someone is verbally or physically abusive to you. Preparing for birth To prepare for the arrival of your baby:  Take prenatal classes to understand, practice, and ask questions about labor and delivery.  Visit the hospital and tour the maternity area.  Purchase a rear-facing car seat and make sure you know how to install it in your car.  Prepare the baby's room or sleeping area. Make sure to remove all pillows and stuffed animals from the baby's crib to prevent suffocation. General instructions  Avoid cat litter boxes and soil used by cats. These carry germs that can cause birth defects in the baby. If you have a cat, ask someone to clean the litter box for you.  Do not douche or use tampons. Do not use scented sanitary pads.  Do not use any products that contain nicotine or tobacco, such as cigarettes, e-cigarettes, and chewing tobacco. If you need help quitting, ask your health care provider.  Do not use any herbal remedies, illegal drugs, or medicines that were not prescribed to you. Chemicals in these products can harm your baby.  Do not drink alcohol.  You will have more frequent prenatal exams during the third trimester. During a routine prenatal visit, your health care provider will do a physical exam, perform tests, and discuss your overall  health. Keep all follow-up visits. This is important. Where to find more information  American Pregnancy Association: americanpregnancy.org  [employer] of [employer]st[employer]ians and Gynecologists: https://www.todd-brady.net/  Office on [employer] Health: MightyReward.co.nz Contact a health care provider if you have:  A fever.  Mild pelvic cramps, pelvic pressure, or nagging pain in your abdominal area or lower back.  Vomiting or diarrhea.  Bad-smelling vaginal discharge or foul-smelling urine.  Pain when you urinate.  A headache that does not go away when you take medicine.  Visual changes or see spots in front of your eyes. Get help right away if:  Your water breaks.  You have regular contractions less than 5 minutes apart.  You have spotting or  bleeding from your vagina.  You have severe abdominal pain.  You have difficulty breathing.  You have chest pain.  You have fainting spells.  You have not felt your baby move for the time period told by your health care provider.  You have new or increased pain, swelling, or redness in an arm or leg. Summary  The third trimester of pregnancy is from week 28 through week 40 (months 7 through 9).  You may have more problems sleeping. This can be caused by the size of your abdomen, an increased need to urinate, and an increase in your body's metabolism.  You will have more frequent prenatal exams during the third trimester. Keep all follow-up visits. This is important. This information is not intended to replace advice given to you by your health care provider. Make sure you discuss any questions you have with your health care provider. Document Revised: 08/01/2019 Document Reviewed: 06/07/2019 Elsevier Patient Education  2021 [employer].

## 2020-07-02 NOTE — Progress Notes (Signed)
LOW-RISK PREGNANCY OFFICE VISIT Patient name: Sara Wilkinson MRN [id number]  Date of birth: 1995-06-22 Chief Complaint:   Routine Prenatal Visit  History of Present Illness:   Sara Wilkinson is a 25 y.o. G2P1001 female at 18w6dwith an Estimated Date of Delivery: 09/25/20 being seen today for ongoing management of a low-risk pregnancy.  Today she reports nausea, vomiting and occasional pelvic pain when getting up. Contractions: Not present. Vag. Bleeding: None.  Movement: Present. denies leaking of fluid. Review of Systems:   Pertinent items are noted in HPI Denies abnormal vaginal discharge w/ itching/odor/irritation, headaches, visual changes, shortness of breath, chest pain, abdominal pain, severe nausea/vomiting, or problems with urination or bowel movements unless otherwise stated above. Pertinent History Reviewed:  Reviewed past medical,surgical, social, obstetrical and family history.  Reviewed problem list, medications and allergies. Physical Assessment:   Vitals:   07/02/20 0837  BP: 131/85  Pulse: (!) 114  Temp: 97.6 F (36.4 C)  Weight: 202 lb 9.6 oz (91.9 kg)  Body mass index is 30.81 kg/m.        Physical Examination:   General appearance: Well appearing, and in no distress  Mental status: Alert, oriented to person, place, and time  Skin: Warm & dry  Cardiovascular: Normal heart rate noted  Respiratory: Normal respiratory effort, no distress  Abdomen: Soft, gravid, nontender  Pelvic: Cervical exam deferred         Extremities: Edema: None  Fetal Status: Fetal Heart Rate (bpm): 159   Movement: Present    No results found for this or any previous visit (from the past 24 hour(s)).  Assessment & Plan:  1) Low-risk pregnancy G2P1001 at 233w6dith an Estimated Date of Delivery: 09/25/20   2) Supervision of other normal pregnancy, antepartum  - Glucose Tolerance, 2 Hours w/1 Hour,  - HIV Antibody (routine testing w rflx),  - RPR,  - CBC,  - Tdap vaccine  greater than or equal to 7yo IM  3) [redacted] weeks gestation of pregnancy   4) Elevated blood pressure affecting pregnancy in third trimester, antepartum  - CBC - Comp Met (CMET),  - Protein / creatinine ratio, urine  5) Pelvic pain affecting pregnancy in third trimester, antepartum  - Rx for Elastic Bandages & Supports (COMFORT FIT MATERNITY SUPP SM) MISC  6) Morning sickness  - Rx for metoCLOPramide (REGLAN) 10 MG tablet    Meds:  Meds ordered this encounter  Medications  . Elastic Bandages & Supports (COMFORT FIT MATERNITY SUPP SM) MISC    Sig: 1 Units by Does not apply route daily as needed.    Dispense:  1 each    Refill:  0    Order Specific Question:   Supervising Provider    Answer:   PRDonnamae Jude2[[id number]]. metoCLOPramide (REGLAN) 10 MG tablet    Sig: Take 1 tablet (10 mg total) by mouth every 6 (six) hours.    Dispense:  60 tablet    Refill:  0    Order Specific Question:   Supervising Provider    Answer:   PRDonnamae Jude2[[id number]] Labs/procedures today: 3rd trimester and PEC labs  Plan:  Continue routine obstetrical care   Reviewed: Preterm labor symptoms and general obstetric precautions including but not limited to vaginal bleeding, contractions, leaking of fluid and fetal movement were reviewed in detail with the patient.  All questions were answered. Has home bp cuff. Check bp weekly, let usKoreanow if >140/90.  Follow-up: Return in about 4 weeks (around 07/30/2020) for Return OB - My Chart video.  Orders Placed This Encounter  Procedures  . Tdap vaccine greater than or equal to 7yo IM  . Glucose Tolerance, 2 Hours w/1 Hour  . HIV Antibody (routine testing w rflx)  . RPR  . CBC  . Comp Met (CMET)  . Protein / creatinine ratio, urine   Laury Deep MSN, CNM 07/02/2020

## 2020-07-03 LAB — CBC
Hematocrit: 40 % (ref 34.0–46.6)
Hemoglobin: 13.6 g/dL (ref 11.1–15.9)
MCH: 30.2 pg (ref 26.6–33.0)
MCHC: 34 g/dL (ref 31.5–35.7)
MCV: 89 fL (ref 79–97)
Platelets: 223 10*3/uL (ref 150–450)
RBC: 4.51 x10E6/uL (ref 3.77–5.28)
RDW: 12.1 % (ref 11.7–15.4)
WBC: 12.4 10*3/uL — ABNORMAL HIGH (ref 3.4–10.8)

## 2020-07-03 LAB — COMPREHENSIVE METABOLIC PANEL
ALT: 7 IU/L (ref 0–32)
AST: 9 IU/L (ref 0–40)
Albumin/Globulin Ratio: 1.4 (ref 1.2–2.2)
Albumin: 3.8 g/dL — ABNORMAL LOW (ref 3.9–5.0)
Alkaline Phosphatase: 98 IU/L (ref 44–121)
BUN/Creatinine Ratio: 5 — ABNORMAL LOW (ref 9–23)
BUN: 3 mg/dL — ABNORMAL LOW (ref 6–20)
Bilirubin Total: <0.2 mg/dL (ref 0.0–1.2)
CO2: 20 mmol/L (ref 20–29)
Calcium: 8.1 mg/dL — ABNORMAL LOW (ref 8.7–10.2)
Chloride: 101 mmol/L (ref 96–106)
Creatinine, Ser: 0.57 mg/dL (ref 0.57–1.00)
Globulin, Total: 2.7 g/dL (ref 1.5–4.5)
Glucose: 82 mg/dL (ref 65–99)
Potassium: 3.4 mmol/L — ABNORMAL LOW (ref 3.5–5.2)
Sodium: 139 mmol/L (ref 134–144)
Total Protein: 6.5 g/dL (ref 6.0–8.5)
eGFR: 131 mL/min/{1.73_m2} (ref >59)

## 2020-07-03 LAB — GLUCOSE TOLERANCE, 2 HOURS W/ 1HR
Glucose, 1 hour: 99 mg/dL (ref 65–179)
Glucose, 2 hour: 85 mg/dL (ref 65–152)
Glucose, Fasting: 82 mg/dL (ref 65–91)

## 2020-07-03 LAB — PROTEIN / CREATININE RATIO, URINE
Creatinine, Urine: 137.4 mg/dL
Protein, Ur: 61.4 mg/dL
Protein/Creat Ratio: 447 mg/g creat — ABNORMAL HIGH (ref 0–200)

## 2020-07-03 LAB — HIV ANTIBODY (ROUTINE TESTING W REFLEX): HIV Screen 4th Generation wRfx: NONREACTIVE

## 2020-07-03 LAB — RPR: RPR Ser Ql: NONREACTIVE

## 2020-07-09 ENCOUNTER — Encounter: Payer: Self-pay | Admitting: Obstetrics & Gynecology

## 2020-07-09 ENCOUNTER — Other Ambulatory Visit: Payer: Self-pay | Admitting: Obstetrics and Gynecology

## 2020-07-09 ENCOUNTER — Encounter: Payer: Self-pay | Admitting: Obstetrics and Gynecology

## 2020-07-09 DIAGNOSIS — O1493 Unspecified pre-eclampsia, third trimester: Secondary | ICD-10-CM | POA: Insufficient documentation

## 2020-07-09 DIAGNOSIS — O1210 Gestational proteinuria, unspecified trimester: Secondary | ICD-10-CM | POA: Insufficient documentation

## 2020-07-21 ENCOUNTER — Other Ambulatory Visit: Payer: Self-pay | Admitting: Obstetrics and Gynecology

## 2020-07-21 DIAGNOSIS — O99213 Obesity complicating pregnancy, third trimester: Secondary | ICD-10-CM

## 2020-07-29 ENCOUNTER — Other Ambulatory Visit: Payer: Self-pay

## 2020-07-29 ENCOUNTER — Ambulatory Visit: Payer: Medicaid Other | Attending: Maternal & Fetal Medicine

## 2020-07-29 DIAGNOSIS — O350XX0 Maternal care for (suspected) central nervous system malformation in fetus, not applicable or unspecified: Secondary | ICD-10-CM | POA: Diagnosis not present

## 2020-07-29 DIAGNOSIS — E669 Obesity, unspecified: Secondary | ICD-10-CM

## 2020-07-29 DIAGNOSIS — Z3A31 31 weeks gestation of pregnancy: Secondary | ICD-10-CM | POA: Diagnosis not present

## 2020-07-29 DIAGNOSIS — O99213 Obesity complicating pregnancy, third trimester: Secondary | ICD-10-CM | POA: Diagnosis not present

## 2020-07-30 ENCOUNTER — Encounter: Payer: Self-pay | Admitting: Obstetrics and Gynecology

## 2020-07-30 ENCOUNTER — Telehealth (INDEPENDENT_AMBULATORY_CARE_PROVIDER_SITE_OTHER): Payer: Medicaid Other | Admitting: Obstetrics and Gynecology

## 2020-07-30 VITALS — BP 101/85 | HR 78 | Wt 205.0 lb

## 2020-07-30 DIAGNOSIS — O0993 Supervision of high risk pregnancy, unspecified, third trimester: Secondary | ICD-10-CM

## 2020-07-30 DIAGNOSIS — Z3A31 31 weeks gestation of pregnancy: Secondary | ICD-10-CM

## 2020-07-30 DIAGNOSIS — O1493 Unspecified pre-eclampsia, third trimester: Secondary | ICD-10-CM

## 2020-07-30 NOTE — Progress Notes (Signed)
MY CHART VIDEO VIRTUAL OBSTETRICS VISIT ENCOUNTER NOTE  I connected with Sara Wilkinson on 07/30/20 at 10:10 AM EDT by My Chart video at home and verified that I am speaking with the correct person using two identifiers. Provider located at [employer] for [employer] at [place].   I discussed the limitations, risks, security and privacy concerns of performing an evaluation and management service by My Chart video and the availability of in person appointments. I also discussed with the patient that there may be a patient responsible charge related to this service. The patient expressed understanding and agreed to proceed.  I discussed the limitations of telemedicine and the availability of in person appointments.  Discussed there is a possibility of technology failure and discussed alternative modes of communication if that failure occurs.  Subjective:  Sara Wilkinson is a 25 y.o. G2P1001 at [redacted]w[redacted]d being followed for ongoing prenatal care.  She is currently monitored for the following issues for this high-risk pregnancy and has Supervision of high-risk pregnancy and Preeclampsia, third trimester on their problem list.  Patient reports no complaints. Reports fetal movement. Denies any contractions, bleeding or leaking of fluid.   The following portions of the patient's history were reviewed and updated as appropriate: allergies, current medications, past family history, past medical history, past social history, past surgical history and problem list.   Objective:   General:  Alert, oriented and cooperative.   Mental Status: Normal mood and affect perceived. Normal judgment and thought content.  Rest of physical exam deferred due to type of encounter  BP 101/85   Pulse 78   Wt 205 lb (93 kg)   LMP 12/20/2019 (Exact Date)   BMI 30.27 kg/m  **Done by patient's own at home BP cuff and scale  Assessment and Plan:  Pregnancy: G2P1001 at [redacted]w[redacted]d  1. Supervision of high risk  pregnancy in third trimester - Stable  2. Preeclampsia, third trimester - Reminded of MFM PEC guidelines - US Fetal BPP W/O Non Stress  3. [redacted] weeks gestation of pregnancy    Preterm labor symptoms and general obstetric precautions including but not limited to vaginal bleeding, contractions, leaking of fluid and fetal movement were reviewed in detail with the patient.  I discussed the assessment and treatment plan with the patient. The patient was provided an opportunity to ask questions and all were answered. The patient agreed with the plan and demonstrated an understanding of the instructions. The patient was advised to call back or seek an in-person office evaluation/go to [facility]U at [facility] for any urgent or concerning symptoms. Please refer to After Visit Summary for other counseling recommendations.   I provided 5 minutes of non-face-to-face time during this encounter. There was 5 minutes of chart review time spent prior to this encounter. Total time spent = 10 minutes.  Return in about 2 weeks (around 08/13/2020) for Return OB visit - Repeat PEC labs.  Future Appointments  Date Time Provider Department Center  08/05/2020  9:15 AM [facility]  08/13/2020  2:15 PM [facility]  08/14/2020  8:50 AM Raelyn Mora, CNM [facility] None  08/20/2020  1:15 PM [facility]  08/27/2020  1:15 PM [facility]  08/28/2020 10:10 AM Raelyn Mora, CNM [facility] None  09/03/2020  2:15 PM [facility]  09/11/2020 10:50 AM Raelyn Mora, CNM [facility] None  09/18/2020 10:50 AM Gerrit Heck, CNM [facility] None  09/25/2020 11:10 AM Raelyn Mora,  CNM [place], [place] for [employer], [place]

## 2020-08-05 ENCOUNTER — Ambulatory Visit (INDEPENDENT_AMBULATORY_CARE_PROVIDER_SITE_OTHER): Payer: Medicaid Other

## 2020-08-05 ENCOUNTER — Encounter: Payer: Self-pay | Admitting: Family Medicine

## 2020-08-05 ENCOUNTER — Ambulatory Visit: Payer: Medicaid Other | Admitting: *Deleted

## 2020-08-05 ENCOUNTER — Other Ambulatory Visit: Payer: Self-pay

## 2020-08-05 VITALS — BP 120/85 | HR 84 | Wt 202.6 lb

## 2020-08-05 DIAGNOSIS — O1493 Unspecified pre-eclampsia, third trimester: Secondary | ICD-10-CM

## 2020-08-05 DIAGNOSIS — Z3A32 32 weeks gestation of pregnancy: Secondary | ICD-10-CM

## 2020-08-13 ENCOUNTER — Other Ambulatory Visit: Payer: Medicaid Other

## 2020-08-14 ENCOUNTER — Ambulatory Visit (INDEPENDENT_AMBULATORY_CARE_PROVIDER_SITE_OTHER): Payer: Medicaid Other | Admitting: Obstetrics and Gynecology

## 2020-08-14 ENCOUNTER — Encounter: Payer: Self-pay | Admitting: Obstetrics and Gynecology

## 2020-08-14 ENCOUNTER — Other Ambulatory Visit: Payer: Self-pay

## 2020-08-14 VITALS — BP 114/75 | HR 79 | Temp 98.0°F | Wt 205.2 lb

## 2020-08-14 DIAGNOSIS — R12 Heartburn: Secondary | ICD-10-CM

## 2020-08-14 DIAGNOSIS — O210 Mild hyperemesis gravidarum: Secondary | ICD-10-CM

## 2020-08-14 DIAGNOSIS — O26893 Other specified pregnancy related conditions, third trimester: Secondary | ICD-10-CM

## 2020-08-14 DIAGNOSIS — O0993 Supervision of high risk pregnancy, unspecified, third trimester: Secondary | ICD-10-CM

## 2020-08-14 DIAGNOSIS — O1213 Gestational proteinuria, third trimester: Secondary | ICD-10-CM

## 2020-08-14 DIAGNOSIS — Z3A34 34 weeks gestation of pregnancy: Secondary | ICD-10-CM

## 2020-08-14 MED ORDER — ONDANSETRON 8 MG PO TBDP: 8.0000 mg | ORAL_TABLET | Freq: Three times a day (TID) | ORAL | 0 refills | Status: DC | PRN

## 2020-08-14 MED ORDER — PANTOPRAZOLE SODIUM 40 MG PO TBEC
40.0000 mg | DELAYED_RELEASE_TABLET | Freq: Every day | ORAL | 0 refills | Status: DC
Start: 2020-08-14 — End: 2020-09-15

## 2020-08-14 NOTE — Patient Instructions (Signed)

## 2020-08-14 NOTE — Progress Notes (Signed)
  HIGH-RISK PREGNANCY OFFICE VISIT Patient name: Sara Wilkinson MRN [id number]  Date of birth: [birth year] Chief Complaint:   Routine Prenatal Visit  History of Present Illness:   Sara Wilkinson is a 25 y.o. G2P1001 female at [redacted]w[redacted]d with an Estimated Date of Delivery: 09/25/20 being seen today for ongoing management of a high-risk pregnancy complicated by  Proteinuria without diagnosis of PEC  Today she reports heartburn, nausea, and vomiting. Contractions: Not present. Vag. Bleeding: None.  Movement: Present. denies leaking of fluid.  Review of Systems:   Pertinent items are noted in HPI Denies abnormal vaginal discharge w/ itching/odor/irritation, headaches, visual changes, shortness of breath, chest pain, abdominal pain, severe nausea/vomiting, or problems with urination or bowel movements unless otherwise stated above. Pertinent History Reviewed:  Reviewed past medical,surgical, social, obstetrical and family history.  Reviewed problem list, medications and allergies. Physical Assessment:   Vitals:   08/14/20 0856  BP: 114/75  Pulse: 79  Temp: 98 F (36.7 C)  Weight: 205 lb 3.2 oz (93.1 kg)  Body mass index is 30.3 kg/m.           Physical Examination:   General appearance: alert, well appearing, and in no distress and oriented to person, place, and time  Mental status: alert, oriented to person, place, and time, normal mood, behavior, speech, dress, motor activity, and thought processes  Skin: warm & dry   Extremities: Edema: None    Cardiovascular: normal heart rate noted  Respiratory: normal respiratory effort, no distress  Abdomen: gravid, soft, non-tender  Pelvic: Cervical exam deferred         Fetal Status: Fetal Heart Rate (bpm): 146 Fundal Height: 33 cm Movement: Present Presentation: Undeterminable  Fetal Surveillance Testing today: none   No results found for this or any previous visit (from the past 24 hour(s)).  Assessment & Plan:  1) High-risk pregnancy  G2P1001 at [redacted]w[redacted]d with an Estimated Date of Delivery: 09/25/20   2) Supervision of high risk pregnancy in third trimester  3) Proteinuria affecting pregnancy in third trimester - Normal BP today - Will f/u with P/C ratio at PP visit, unless BP increase  4) [redacted] weeks gestation of pregnancy   Meds:  Meds ordered this encounter  Medications   pantoprazole (PROTONIX) 40 MG tablet    Sig: Take 1 tablet (40 mg total) by mouth daily.    Dispense:  30 tablet    Refill:  0    Order Specific Question:   Supervising Provider    Answer:   Reva Bores [[id number]]   ondansetron (ZOFRAN ODT) 8 MG disintegrating tablet    Sig: Take 1 tablet (8 mg total) by mouth every 8 (eight) hours as needed for nausea or vomiting.    Dispense:  24 tablet    Refill:  0    Order Specific Question:   Supervising Provider    Answer:   Reva Bores [[id number]]    Labs/procedures today: none  Treatment Plan:  Weekly antenatal testing  Reviewed: Preterm labor symptoms and general obstetric precautions including but not limited to vaginal bleeding, contractions, leaking of fluid and fetal movement were reviewed in detail with the patient.  All questions were answered. Has home bp cuff. Check bp weekly, let us know if >140/90.   Follow-up: Return in about 2 weeks (around 08/28/2020).  No orders of the defined types were placed in this encounter.  Raelyn Mora MSN, CNM 08/14/2020 9:07 AM

## 2020-08-20 ENCOUNTER — Other Ambulatory Visit: Payer: Medicaid Other

## 2020-08-27 ENCOUNTER — Other Ambulatory Visit: Payer: Medicaid Other

## 2020-08-28 ENCOUNTER — Encounter: Payer: Self-pay | Admitting: Obstetrics and Gynecology

## 2020-08-28 ENCOUNTER — Other Ambulatory Visit: Payer: Self-pay

## 2020-08-28 ENCOUNTER — Ambulatory Visit (INDEPENDENT_AMBULATORY_CARE_PROVIDER_SITE_OTHER): Payer: Medicaid Other | Admitting: Obstetrics and Gynecology

## 2020-08-28 ENCOUNTER — Other Ambulatory Visit (HOSPITAL_COMMUNITY)
Admission: RE | Admit: 2020-08-28 | Discharge: 2020-08-28 | Disposition: A | Discharge status: Home / Self Care | Payer: Medicaid Other | Source: Ambulatory Visit | Attending: Obstetrics and Gynecology | Admitting: Obstetrics and Gynecology

## 2020-08-28 VITALS — BP 128/84 | HR 81 | Temp 98.1°F | Wt 210.8 lb

## 2020-08-28 DIAGNOSIS — O1213 Gestational proteinuria, third trimester: Secondary | ICD-10-CM

## 2020-08-28 DIAGNOSIS — Z3A36 36 weeks gestation of pregnancy: Secondary | ICD-10-CM | POA: Insufficient documentation

## 2020-08-28 DIAGNOSIS — O0993 Supervision of high risk pregnancy, unspecified, third trimester: Secondary | ICD-10-CM

## 2020-08-28 NOTE — Progress Notes (Signed)
  HIGH-RISK PREGNANCY OFFICE VISIT Patient name: Sara Wilkinson MRN [id number]  Date of birth: 04-22-1995 Chief Complaint:   Routine Prenatal Visit  History of Present Illness:   Sara Wilkinson is a 25 y.o. G2P1001 female at [redacted]w[redacted]d with an Estimated Date of Delivery: 09/25/20 being seen today for ongoing management of a high-risk pregnancy complicated by  proteinuria in pregnancy Today she reports occasional contractions. Contractions: Not present. Vag. Bleeding: None.  Movement: Present. denies leaking of fluid.  Review of Systems:   Pertinent items are noted in HPI Denies abnormal vaginal discharge w/ itching/odor/irritation, headaches, visual changes, shortness of breath, chest pain, abdominal pain, severe nausea/vomiting, or problems with urination or bowel movements unless otherwise stated above. Pertinent History Reviewed:  Reviewed past medical,surgical, social, obstetrical and family history.  Reviewed problem list, medications and allergies. Physical Assessment:   Vitals:   08/28/20 1034  BP: 128/84  Pulse: 81  Temp: 98.1 F (36.7 C)  Weight: 210 lb 12.8 oz (95.6 kg)  Body mass index is 31.13 kg/m.           Physical Examination:   General appearance: alert, well appearing, and in no distress and oriented to person, place, and time  Mental status: alert, oriented to person, place, and time, normal mood, behavior, speech, dress, motor activity, and thought processes  Skin: warm & dry   Extremities: Edema: None    Cardiovascular: normal heart rate noted  Respiratory: normal respiratory effort, no distress  Abdomen: gravid, soft, non-tender  Pelvic: Cervical exam performed  Dilation: Closed Effacement (%): Thick Station: Ballotable  Fetal Status: Fetal Heart Rate (bpm): 132 Fundal Height: 34 cm Movement: Present Presentation: Vertex  Fetal Surveillance Testing today: none   No results found for this or any previous visit (from the past 24 hour(s)).  Assessment &  Plan:  1) High-risk pregnancy G2P1001 at [redacted]w[redacted]d with an Estimated Date of Delivery: 09/25/20   2) Supervision of high risk pregnancy in third trimester - Mild range diastolic BP, continue to watch BP  3) Proteinuria affecting pregnancy in third trimester  4) [redacted] weeks gestation of pregnancy   Meds: No orders of the defined types were placed in this encounter.   Labs/procedures today: GBS, GC/CT and cervical exam   Reviewed: Preterm labor symptoms and general obstetric precautions including but not limited to vaginal bleeding, contractions, leaking of fluid and fetal movement were reviewed in detail with the patient.  All questions were answered. Has home bp cuff. Check bp weekly, let us know if >140/90.   Follow-up: Return in about 1 week (around 09/04/2020) for Return OB visit.  Orders Placed This Encounter  Procedures   Culture, beta strep (group b only)    Raelyn Mora MSN, CNM 08/28/2020 10:38 AM

## 2020-08-29 LAB — CERVICOVAGINAL ANCILLARY ONLY
Chlamydia: NEGATIVE
Comment: NEGATIVE
Comment: NEGATIVE
Comment: NORMAL
Neisseria Gonorrhea: NEGATIVE
Trichomonas: NEGATIVE

## 2020-09-01 LAB — CULTURE, BETA STREP (GROUP B ONLY): Strep Gp B Culture: POSITIVE — AB

## 2020-09-03 ENCOUNTER — Other Ambulatory Visit: Payer: Medicaid Other

## 2020-09-11 ENCOUNTER — Other Ambulatory Visit: Payer: Self-pay

## 2020-09-11 ENCOUNTER — Ambulatory Visit (INDEPENDENT_AMBULATORY_CARE_PROVIDER_SITE_OTHER): Payer: Medicaid Other | Admitting: Obstetrics and Gynecology

## 2020-09-11 ENCOUNTER — Encounter: Payer: Self-pay | Admitting: Obstetrics and Gynecology

## 2020-09-11 VITALS — BP 129/86 | HR 87 | Wt 205.2 lb

## 2020-09-11 DIAGNOSIS — Z3A38 38 weeks gestation of pregnancy: Secondary | ICD-10-CM

## 2020-09-11 DIAGNOSIS — Z3480 Encounter for supervision of other normal pregnancy, unspecified trimester: Secondary | ICD-10-CM

## 2020-09-11 NOTE — Progress Notes (Signed)
   LOW-RISK PREGNANCY OFFICE VISIT Patient name: Sara Wilkinson MRN [id number]  Date of birth: 11-06-1995 Chief Complaint:   Routine Prenatal Visit  History of Present Illness:   Sara Wilkinson is a 25 y.o. G50P1001 female at [redacted]w[redacted]d with an Estimated Date of Delivery: 09/25/20 being seen today for ongoing management of a low-risk pregnancy.  Today she reports  fatigue and BH ctxs . Contractions: Not present. Vag. Bleeding: None.  Movement: Present. denies leaking of fluid. Review of Systems:   Pertinent items are noted in HPI Denies abnormal vaginal discharge w/ itching/odor/irritation, headaches, visual changes, shortness of breath, chest pain, abdominal pain, severe nausea/vomiting, or problems with urination or bowel movements unless otherwise stated above. Pertinent History Reviewed:  Reviewed past medical,surgical, social, obstetrical and family history.  Reviewed problem list, medications and allergies. Physical Assessment:   Vitals:   09/11/20 1058  BP: 129/86  Pulse: 87  Weight: 205 lb 3.2 oz (93.1 kg)  Body mass index is 30.3 kg/m.        Physical Examination:   General appearance: Well appearing, and in no distress  Mental status: Alert, oriented to person, place, and time  Skin: Warm & dry  Cardiovascular: Normal heart rate noted  Respiratory: Normal respiratory effort, no distress  Abdomen: Soft, gravid, nontender  Pelvic: Cervical exam performed  Dilation: Closed Effacement (%): Thick Station: Ballotable  Extremities: Edema: None  Fetal Status: Fetal Heart Rate (bpm): 145 Fundal Height: 35 cm Movement: Present Presentation: Vertex  No results found for this or any previous visit (from the past 24 hour(s)).  Assessment & Plan:  1) Low-risk pregnancy G2P1001 at [redacted]w[redacted]d with an Estimated Date of Delivery: 09/25/20   2) Supervision of other normal pregnancy, antepartum - Discussed labor precautions  3) [redacted] weeks gestation of pregnancy    Meds: No orders of the  defined types were placed in this encounter.  Labs/procedures today: cervical exam  Plan:  Continue routine obstetrical care   Reviewed: Term labor symptoms and general obstetric precautions including but not limited to vaginal bleeding, contractions, leaking of fluid and fetal movement were reviewed in detail with the patient.  All questions were answered. Has home bp cuff. Check bp weekly, let us know if >140/90.   Follow-up: No follow-ups on file.  No orders of the defined types were placed in this encounter.  Raelyn Mora MSN, CNM 09/11/2020 4:27 PM

## 2020-09-12 ENCOUNTER — Other Ambulatory Visit: Payer: Self-pay

## 2020-09-12 ENCOUNTER — Inpatient Hospital Stay (HOSPITAL_COMMUNITY)
Admission: AD | Admit: 2020-09-12 | Discharge: 2020-09-15 | DRG: 805 | Disposition: A | Discharge status: Home / Self Care | Payer: Medicaid Other | Attending: Obstetrics & Gynecology | Admitting: Obstetrics & Gynecology

## 2020-09-12 ENCOUNTER — Encounter (HOSPITAL_COMMUNITY): Payer: Self-pay | Admitting: Obstetrics and Gynecology

## 2020-09-12 DIAGNOSIS — O99824 Streptococcus B carrier state complicating childbirth: Secondary | ICD-10-CM | POA: Diagnosis present

## 2020-09-12 DIAGNOSIS — U071 COVID-19: Secondary | ICD-10-CM | POA: Diagnosis present

## 2020-09-12 DIAGNOSIS — O9852 Other viral diseases complicating childbirth: Secondary | ICD-10-CM | POA: Diagnosis present

## 2020-09-12 DIAGNOSIS — Z30017 Encounter for initial prescription of implantable subdermal contraceptive: Secondary | ICD-10-CM

## 2020-09-12 DIAGNOSIS — Z3A38 38 weeks gestation of pregnancy: Secondary | ICD-10-CM

## 2020-09-12 DIAGNOSIS — O26893 Other specified pregnancy related conditions, third trimester: Secondary | ICD-10-CM | POA: Diagnosis present

## 2020-09-12 LAB — CBC
HCT: 38.1 % (ref 36.0–46.0)
Hemoglobin: 13 g/dL (ref 12.0–15.0)
MCH: 30.3 pg (ref 26.0–34.0)
MCHC: 34.1 g/dL (ref 30.0–36.0)
MCV: 88.8 fL (ref 80.0–100.0)
Platelets: 130 10*3/uL — ABNORMAL LOW (ref 150–400)
RBC: 4.29 MIL/uL (ref 3.87–5.11)
RDW: 13 % (ref 11.5–15.5)
WBC: 8 10*3/uL (ref 4.0–10.5)
nRBC: 0 % (ref 0.0–0.2)

## 2020-09-12 LAB — RESP PANEL BY RT-PCR (FLU A&B, COVID) ARPGX2
Influenza A by PCR: NEGATIVE
Influenza B by PCR: NEGATIVE
SARS Coronavirus 2 by RT PCR: POSITIVE — AB

## 2020-09-12 LAB — TYPE AND SCREEN
ABO/RH(D): O POS
Antibody Screen: NEGATIVE

## 2020-09-12 MED ORDER — ACETAMINOPHEN 325 MG PO TABS
650.0000 mg | ORAL_TABLET | ORAL | Status: DC | PRN
  Administered 2020-09-12: 650 mg via ORAL
  Filled 2020-09-12: qty 2

## 2020-09-12 MED ORDER — EPHEDRINE 5 MG/ML INJ: 10.0000 mg | INTRAVENOUS | Status: DC | PRN

## 2020-09-12 MED ORDER — FENTANYL CITRATE (PF) 100 MCG/2ML IJ SOLN: 100.0000 ug | INTRAMUSCULAR | Status: DC | PRN

## 2020-09-12 MED ORDER — LIDOCAINE HCL (PF) 1 % IJ SOLN: 30.0000 mL | INTRAMUSCULAR | Status: DC | PRN

## 2020-09-12 MED ORDER — PHENYLEPHRINE 40 MCG/ML (10ML) SYRINGE FOR IV PUSH (FOR BLOOD PRESSURE SUPPORT)
80.0000 ug | PREFILLED_SYRINGE | INTRAVENOUS | Status: DC | PRN
  Filled 2020-09-12: qty 10

## 2020-09-12 MED ORDER — OXYTOCIN BOLUS FROM INFUSION
333.0000 mL | Freq: Once | INTRAVENOUS | Status: AC
  Administered 2020-09-13: 333 mL via INTRAVENOUS

## 2020-09-12 MED ORDER — SODIUM CHLORIDE 0.9 % IV SOLN
5.0000 10*6.[IU] | Freq: Once | INTRAVENOUS | Status: AC
  Administered 2020-09-12: 5 10*6.[IU] via INTRAVENOUS
  Filled 2020-09-12: qty 5

## 2020-09-12 MED ORDER — PENICILLIN G POT IN DEXTROSE 60000 UNIT/ML IV SOLN
3.0000 10*6.[IU] | INTRAVENOUS | Status: DC
Start: 2020-09-13 — End: 2020-09-13
  Administered 2020-09-13 (×2): 3 10*6.[IU] via INTRAVENOUS
  Filled 2020-09-12 (×2): qty 50

## 2020-09-12 MED ORDER — LACTATED RINGERS IV SOLN: 500.0000 mL | Freq: Once | INTRAVENOUS | Status: DC

## 2020-09-12 MED ORDER — SOD CITRATE-CITRIC ACID 500-334 MG/5ML PO SOLN: 30.0000 mL | ORAL | Status: DC | PRN

## 2020-09-12 MED ORDER — DIPHENHYDRAMINE HCL 50 MG/ML IJ SOLN: 12.5000 mg | INTRAMUSCULAR | Status: DC | PRN

## 2020-09-12 MED ORDER — LACTATED RINGERS IV SOLN: INTRAVENOUS | Status: DC

## 2020-09-12 MED ORDER — FENTANYL-BUPIVACAINE-NACL 0.5-0.125-0.9 MG/250ML-% EP SOLN
12.0000 mL/h | EPIDURAL | Status: DC | PRN
Start: 2020-09-12 — End: 2020-09-13
  Administered 2020-09-13: 12 mL/h via EPIDURAL
  Filled 2020-09-12: qty 250

## 2020-09-12 MED ORDER — ONDANSETRON HCL 4 MG/2ML IJ SOLN: 4.0000 mg | Freq: Four times a day (QID) | INTRAMUSCULAR | Status: DC | PRN

## 2020-09-12 MED ORDER — OXYTOCIN-SODIUM CHLORIDE 30-0.9 UT/500ML-% IV SOLN
2.5000 [IU]/h | INTRAVENOUS | Status: DC
  Administered 2020-09-13: 2.5 [IU]/h via INTRAVENOUS

## 2020-09-12 MED ORDER — PHENYLEPHRINE 40 MCG/ML (10ML) SYRINGE FOR IV PUSH (FOR BLOOD PRESSURE SUPPORT): 80.0000 ug | PREFILLED_SYRINGE | INTRAVENOUS | Status: DC | PRN

## 2020-09-12 MED ORDER — LACTATED RINGERS IV SOLN: 500.0000 mL | INTRAVENOUS | Status: DC | PRN

## 2020-09-12 NOTE — MAU Note (Signed)
Sara Wilkinson is a 25 y.o. at [redacted]w[redacted]d here in MAU reporting: contractions since 2330 last night, they are every 5-10 minutes. No bleeding. No LOF. +FM. Also reporting a lack of appetite the past few days.   Onset of complaint: last night  Pain score: 7/10  Vitals:   09/12/20 1619  BP: 109/72  Pulse: (!) 111  Resp: 20  Temp: 99.1 F (37.3 C)  SpO2: 100%     FHT: +FM, pt wearing a romper  Lab orders placed from triage: none

## 2020-09-13 ENCOUNTER — Encounter (HOSPITAL_COMMUNITY): Payer: Self-pay | Admitting: Obstetrics & Gynecology

## 2020-09-13 ENCOUNTER — Inpatient Hospital Stay (HOSPITAL_COMMUNITY): Payer: Medicaid Other | Admitting: Anesthesiology

## 2020-09-13 DIAGNOSIS — Z3A38 38 weeks gestation of pregnancy: Secondary | ICD-10-CM

## 2020-09-13 DIAGNOSIS — O9852 Other viral diseases complicating childbirth: Secondary | ICD-10-CM

## 2020-09-13 DIAGNOSIS — O99824 Streptococcus B carrier state complicating childbirth: Secondary | ICD-10-CM

## 2020-09-13 LAB — RAPID HIV SCREEN (HIV 1/2 AB+AG)
HIV 1/2 Antibodies: NONREACTIVE
HIV-1 P24 Antigen - HIV24: NONREACTIVE

## 2020-09-13 LAB — HEPATITIS B SURFACE ANTIGEN: Hepatitis B Surface Ag: NONREACTIVE

## 2020-09-13 LAB — HEPATITIS C ANTIBODY: HCV Ab: NONREACTIVE

## 2020-09-13 MED ORDER — COCONUT OIL OIL: 1.0000 "application " | TOPICAL_OIL | Status: DC | PRN

## 2020-09-13 MED ORDER — SENNOSIDES-DOCUSATE SODIUM 8.6-50 MG PO TABS
2.0000 | ORAL_TABLET | ORAL | Status: DC
  Administered 2020-09-13 – 2020-09-14 (×2): 2 via ORAL
  Filled 2020-09-13 (×3): qty 2

## 2020-09-13 MED ORDER — SIMETHICONE 80 MG PO CHEW: 80.0000 mg | CHEWABLE_TABLET | ORAL | Status: DC | PRN

## 2020-09-13 MED ORDER — TETANUS-DIPHTH-ACELL PERTUSSIS 5-2.5-18.5 LF-MCG/0.5 IM SUSY: 0.5000 mL | PREFILLED_SYRINGE | Freq: Once | INTRAMUSCULAR | Status: DC

## 2020-09-13 MED ORDER — TERBUTALINE SULFATE 1 MG/ML IJ SOLN: 0.2500 mg | Freq: Once | INTRAMUSCULAR | Status: DC | PRN

## 2020-09-13 MED ORDER — IBUPROFEN 600 MG PO TABS
600.0000 mg | ORAL_TABLET | Freq: Four times a day (QID) | ORAL | Status: DC
  Administered 2020-09-13 – 2020-09-15 (×9): 600 mg via ORAL
  Filled 2020-09-13 (×9): qty 1

## 2020-09-13 MED ORDER — MEASLES, MUMPS & RUBELLA VAC IJ SOLR: 0.5000 mL | Freq: Once | INTRAMUSCULAR | Status: DC

## 2020-09-13 MED ORDER — ACETAMINOPHEN 325 MG PO TABS
650.0000 mg | ORAL_TABLET | ORAL | Status: DC | PRN
  Administered 2020-09-14: 650 mg via ORAL
  Filled 2020-09-13: qty 2

## 2020-09-13 MED ORDER — BENZOCAINE-MENTHOL 20-0.5 % EX AERO: 1.0000 "application " | INHALATION_SPRAY | CUTANEOUS | Status: DC | PRN

## 2020-09-13 MED ORDER — DIPHENHYDRAMINE HCL 25 MG PO CAPS: 25.0000 mg | ORAL_CAPSULE | Freq: Four times a day (QID) | ORAL | Status: DC | PRN

## 2020-09-13 MED ORDER — LIDOCAINE-EPINEPHRINE (PF) 2 %-1:200000 IJ SOLN
INTRAMUSCULAR | Status: DC | PRN
  Administered 2020-09-13: 4 mL via EPIDURAL

## 2020-09-13 MED ORDER — ONDANSETRON HCL 4 MG/2ML IJ SOLN: 4.0000 mg | INTRAMUSCULAR | Status: DC | PRN

## 2020-09-13 MED ORDER — OXYTOCIN-SODIUM CHLORIDE 30-0.9 UT/500ML-% IV SOLN
1.0000 m[IU]/min | INTRAVENOUS | Status: DC
  Administered 2020-09-13: 2 m[IU]/min via INTRAVENOUS
  Filled 2020-09-13: qty 500

## 2020-09-13 MED ORDER — PRENATAL MULTIVITAMIN CH
1.0000 | ORAL_TABLET | Freq: Every day | ORAL | Status: DC
  Administered 2020-09-13 – 2020-09-15 (×3): 1 via ORAL
  Filled 2020-09-13 (×3): qty 1

## 2020-09-13 MED ORDER — ONDANSETRON HCL 4 MG PO TABS: 4.0000 mg | ORAL_TABLET | ORAL | Status: DC | PRN

## 2020-09-13 MED ORDER — DIBUCAINE (PERIANAL) 1 % EX OINT: 1.0000 "application " | TOPICAL_OINTMENT | CUTANEOUS | Status: DC | PRN

## 2020-09-13 MED ORDER — WITCH HAZEL-GLYCERIN EX PADS: 1.0000 "application " | MEDICATED_PAD | CUTANEOUS | Status: DC | PRN

## 2020-09-13 NOTE — Discharge Summary (Addendum)
Postpartum Discharge Summary   Patient Name: Sara Wilkinson DOB: 06-Apr-1995 MRN: [id number]  Date of admission: 09/12/2020 Delivery date:09/13/2020  Delivering provider: Erskine Emery  Date of discharge: 09/15/2020  Admitting diagnosis: Indication for care in labor or delivery [O75.9] Intrauterine pregnancy: [redacted]w[redacted]d    Secondary diagnosis:  Active Problems:   Indication for care in labor or delivery  Additional problems: Covid    Discharge diagnosis: Term Pregnancy Delivered and Covid                                               Post partum procedures: None  Augmentation: Pitocin Complications: None  Hospital course: Onset of Labor With Vaginal Delivery      25y.o. yo G2P1001 at 318w2das admitted in Latent Labor on 09/12/2020. Patient had an uncomplicated labor course as follows:  Labor progressed slowly so we augmented with Pitocin Membrane Rupture Time/Date: 7:50 AM ,09/13/2020   Delivery Method:Vaginal, Spontaneous  Episiotomy: None  Lacerations:   bilateral labial tears, R labial repaired Patient had an uncomplicated postpartum course.  She is ambulating, tolerating a regular diet, passing flatus, and urinating well. Patient is discharged home in stable condition on 09/15/20.  Newborn Data: Birth date:09/13/2020  Birth time:8:09 AM  Gender:Female  Living status:Living  Apgars:9 ,9  Weight:2630 g   Magnesium Sulfate received: No BMZ received: No Rhophylac:N/A MMR:No T-DaP: Pt reports given prior to admission Flu: N/A Transfusion:No  Physical exam  Vitals:   09/14/20 0641 09/14/20 1500 09/14/20 2144 09/15/20 0605  BP: 118/63 118/67 120/80 107/69  Pulse: (!) 52 67 61 (!) 48  Resp: 18 18 20 18   Temp: 97.7 F (36.5 C) 98 F (36.7 C) 97.7 F (36.5 C) 97.9 F (36.6 C)  TempSrc: Oral Oral Oral Oral  SpO2:   99% 100%  Weight:      Height:       General: alert, cooperative, and no distress Lochia: appropriate Uterine Fundus: firm Incision: N/A DVT  Evaluation: No evidence of DVT seen on physical exam. Negative Homan's sign. No cords or calf tenderness. Labs: Lab Results  Component Value Date   WBC 8.0 09/12/2020   HGB 13.0 09/12/2020   HCT 38.1 09/12/2020   MCV 88.8 09/12/2020   PLT 130 (L) 09/12/2020   CMP Latest Ref Rng & Units 07/02/2020  Glucose 65 - 99 mg/dL 82  BUN 6 - 20 mg/dL 3(L)  Creatinine 0.57 - 1.00 mg/dL 0.57  Sodium 134 - 144 mmol/L 139  Potassium 3.5 - 5.2 mmol/L 3.4(L)  Chloride 96 - 106 mmol/L 101  CO2 20 - 29 mmol/L 20  Calcium 8.7 - 10.2 mg/dL 8.1(L)  Total Protein 6.0 - 8.5 g/dL 6.5  Total Bilirubin 0.0 - 1.2 mg/dL <0.2  Alkaline Phos 44 - 121 IU/L 98  AST 0 - 40 IU/L 9  ALT 0 - 32 IU/L 7   Edinburgh Score: Edinburgh Postnatal Depression Scale Screening Tool 09/13/2020  I have been able to laugh and see the funny side of things. 0  I have looked forward with enjoyment to things. 0  I have blamed myself unnecessarily when things went wrong. 0  I have been anxious or worried for no good reason. 0  I have felt scared or panicky for no good reason. 0  Things have been getting on top of me. 0  I have been so unhappy that I have had difficulty sleeping. 0  I have felt sad or miserable. 0  I have been so unhappy that I have been crying. 0  The thought of harming myself has occurred to me. 0  Edinburgh Postnatal Depression Scale Total 0      After visit meds:  Allergies as of 09/15/2020       Reactions   Hydrocodone Hives, Other (See Comments)   Gets the "shakes"        Medication List     STOP taking these medications    Blood Pressure Monitor [place] Supp Sm Misc   Gojji Weight Scale Misc   metoCLOPramide 10 MG tablet Commonly known as: REGLAN   ondansetron 8 MG disintegrating tablet Commonly known as: Zofran ODT   pantoprazole 40 MG tablet Commonly known as: Protonix   Vitafol Gummies 3.33-0.333-34.8 MG Chew       TAKE these medications     acetaminophen 325 MG tablet Commonly known as: Tylenol Take 2 tablets (650 mg total) by mouth every 4 (four) hours as needed (for pain scale < 4).   benzocaine-Menthol 20-0.5 % Aero Commonly known as: DERMOPLAST Apply 1 application topically as needed for irritation (perineal discomfort).   coconut oil Oil Apply 1 application topically as needed.   ibuprofen 600 MG tablet Commonly known as: ADVIL Take 1 tablet (600 mg total) by mouth every 6 (six) hours.   prenatal multivitamin Tabs tablet Take 1 tablet by mouth daily at 12 noon.         Discharge home in stable condition Infant Feeding: Breast Infant Disposition:home with mother Discharge instruction: per After Visit Summary and Postpartum booklet. Activity: Advance as tolerated. Pelvic rest for 6 weeks.  Diet: routine diet Anticipated Birth Control: Nexplanon placed inpatient (see separate procedure note) Postpartum Appointment: 4-6 weeks Additional Postpartum F/U:  N/A Future Appointments:   Mallie Snooks, [place], MSN, CNM Certified Nurse Midwife, [facility] for [facility], [place] 09/15/20 10:44 AM

## 2020-09-13 NOTE — Anesthesia Preprocedure Evaluation (Signed)
Anesthesia Evaluation  Patient identified by MRN, date of birth, ID band Patient awake    Reviewed: Allergy & Precautions, NPO status , Patient's Chart, lab work & pertinent test results  Airway Mallampati: II  TM Distance: >3 FB Neck ROM: Full    Dental no notable dental hx.    Pulmonary asthma ,  COVID positive with fever   Pulmonary exam normal breath sounds clear to auscultation       Cardiovascular negative cardio ROS Normal cardiovascular exam Rhythm:Regular Rate:Normal     Neuro/Psych negative neurological ROS  negative psych ROS   GI/Hepatic negative GI ROS, Neg liver ROS,   Endo/Other  negative endocrine ROS  Renal/GU negative Renal ROS  negative genitourinary   Musculoskeletal negative musculoskeletal ROS (+)   Abdominal   Peds  Hematology negative hematology ROS (+)   Anesthesia Other Findings   Reproductive/Obstetrics (+) Pregnancy                             Anesthesia Physical Anesthesia Plan  ASA: 3  Anesthesia Plan: Epidural   Post-op Pain Management:    Induction:   PONV Risk Score and Plan: Treatment may vary due to age or medical condition  Airway Management Planned: Natural Airway  Additional Equipment:   Intra-op Plan:   Post-operative Plan:   Informed Consent: I have reviewed the patients History and Physical, chart, labs and discussed the procedure including the risks, benefits and alternatives for the proposed anesthesia with the patient or authorized representative who has indicated his/her understanding and acceptance.       Plan Discussed with: Anesthesiologist  Anesthesia Plan Comments: (Patient identified. Risks, benefits, options discussed with patient including but not limited to bleeding, infection, nerve damage, paralysis, failed block, incomplete pain control, headache, blood pressure changes, nausea, vomiting, reactions to medication,  itching, and post partum back pain. Confirmed with bedside nurse the patient's most recent platelet count. Confirmed with the patient that they are not taking any anticoagulation, have any bleeding history or any family history of bleeding disorders. Patient expressed understanding and wishes to proceed. All questions were answered. )        Anesthesia Quick Evaluation

## 2020-09-13 NOTE — Progress Notes (Signed)
Patient ID: Sara Wilkinson, female   DOB: [birth year], 25 y.o.   MRN: [id number] Has progressed slowly throughout the night. Vitals:   09/13/20 0515 09/13/20 0521 09/13/20 0530 09/13/20 0600  BP: 114/61 (!) 100/37 121/74 110/61  Pulse: 95 94 91 86  Resp: 17 18    Temp:      TempSrc:      SpO2:      Weight:      Height:       FHR reassuring UCs every 4-61min  Dilation: 5 Effacement (%): 70 Station: -1 Presentation: Vertex Exam by:: Carlyn Reichert RN  Will start Pitocin augmentation.

## 2020-09-13 NOTE — H&P (Addendum)
OBSTETRIC ADMISSION HISTORY AND PHYSICAL  Sara Wilkinson is a 25 y.o. female G2P1001 with IUP at [redacted]w[redacted]d by LMP presenting for latent labor. Pt began to feel contractions 2330 in the morning at 5-10 min apart. She reports +FMs, No LOF, no VB, no blurry vision, headaches or peripheral edema, and RUQ pain.  She plans on breast feeding. She requests Nexplanon for birth control. [facility]    Dating: By LMP --->  Estimated Date of Delivery: 09/25/20  Sono:   07/29/20@[redacted]w[redacted]d , CWD, normal anatomy, cephalic presentation,  1713g, 23% EFW  Prenatal History/Complications:  COVID  Asthma  Past Medical History: Past Medical History:  Diagnosis Date   Asthma childhood   Pt has used inhaler in past/ cannot recall   Asthma childhood   Pt still keeps inhaler available   Asthma childhood   In case needed   Asthma childhood   Childhood asthma   Chlamydia     Past Surgical History: Past Surgical History:  Procedure Laterality Date   NO PAST SURGERIES     WISDOM TOOTH EXTRACTION Bilateral     Obstetrical History: OB History     Gravida  2   Para  1   Term  1   Preterm      AB      Living  1      SAB      IAB      Ectopic      Multiple  0   Live Births  1           Social History Social History   Socioeconomic History   Marital status: Married    Spouse name: Not on file   Number of children: Not on file   Years of education: Not on file   Highest education level: Not on file  Occupational History   Not on file  Tobacco Use   Smoking status: Never   Smokeless tobacco: Never  Vaping Use   Vaping Use: Never used  Substance and Sexual Activity   Alcohol use: No   Drug use: No   Sexual activity: Yes    Birth control/protection: None  Other Topics Concern   Not on file  Social History Narrative   Not on file   Social Determinants of Health   Financial Resource Strain: Not on file  Food Insecurity: Not on file   Transportation Needs: Not on file  Physical Activity: Not on file  Stress: Not on file  Social Connections: Not on file    Family History: History reviewed. No pertinent family history.  Allergies: Allergies  Allergen Reactions   Hydrocodone Hives and Other (See Comments)    Gets the "shakes"    Pt denies allergies to latex, iodine, or shellfish.  Medications Prior to Admission  Medication Sig Dispense Refill Last Dose   pantoprazole (PROTONIX) 40 MG tablet Take 1 tablet (40 mg total) by mouth daily. 30 tablet 0 09/11/2020 at 1000   Prenatal Vit-Fe Phos-FA-Omega (VITAFOL GUMMIES) 3.33-0.333-34.8 MG CHEW Chew 1 tablet by mouth daily. 90 tablet 5 09/11/2020 at 1000   Blood Pressure Monitoring (BLOOD PRESSURE MONITOR AUTOMAT) DEVI 1 Device by Does not apply route daily. Automatic blood pressure cuff regular size. To monitor blood pressure regularly at home. ICD-10 code:Z34.90 1 each 0    Elastic Bandages & Supports (COMFORT FIT MATERNITY SUPP SM) MISC 1 Units by Does not apply route daily as needed. 1 each 0  metoCLOPramide (REGLAN) 10 MG tablet Take 1 tablet (10 mg total) by mouth every 6 (six) hours. 60 tablet 0    Misc. Devices (GOJJI WEIGHT SCALE) MISC 1 Device by Does not apply route daily as needed. To weight self daily as needed at home. ICD-10 code: Z34.90 1 each 0    ondansetron (ZOFRAN ODT) 8 MG disintegrating tablet Take 1 tablet (8 mg total) by mouth every 8 (eight) hours as needed for nausea or vomiting. 24 tablet 0      Review of Systems   All systems reviewed and negative except as stated in HPI  Blood pressure 115/66, pulse 94, temperature 98.7 F (37.1 C), temperature source Axillary, resp. rate 18, height 5\' 8"  (1.727 m), weight 92.1 kg, last menstrual period 12/20/2019, SpO2 100 %, unknown if currently breastfeeding. General appearance: alert, cooperative, and no distress Lungs: clear to auscultation bilaterally Heart: regular rate and rhythm Abdomen: soft,  non-tender; bowel sounds normal Pelvic: As stated below  Extremities: Homans sign is negative, no sign of DVT Presentation: cephalic 5/24 6/24 Fetal monitoringBaseline: 120 bpm, Variability: Good {> 6 bpm), Accelerations: Reactive, and Decelerations: Absent Uterine activityFrequency: Every 4 minutes Dilation: 4 Effacement (%): 60 Station: -2 Exam by:: [email or web address], MD   Prenatal labs: ABO, Rh: --/--/O POS (07/08 2047) Antibody: NEG (07/08 2047) Rubella: 1.91 (01/05 1415) RPR: Non Reactive (04/27 0842)  HBsAg: Negative (01/05 1415)  HIV: Non Reactive (04/27 0842)  GBS: Positive/-- (06/23 1051)  1 hr Glucola: nl Genetic screening:  nl Anatomy 01-15-1972: nl  Prenatal Transfer Tool  Maternal Diabetes: No Genetic Screening: Normal Maternal Ultrasounds/Referrals: Normal Fetal Ultrasounds or other Referrals:  None Maternal Substance Abuse:  No Significant Maternal Medications:  None Significant Maternal Lab Results: Group B Strep positive  Results for orders placed or performed during the hospital encounter of 09/12/20 (from the past 24 hour(s))  Type and screen [facility]   Collection Time: 09/12/20  8:47 PM  Result Value Ref Range   ABO/RH(D) O POS    Antibody Screen NEG    Sample Expiration      09/15/2020,2359 Performed at [facility], [address], [place], [place]   CBC   Collection Time: 09/12/20  8:48 PM  Result Value Ref Range   WBC 8.0 4.0 - 10.5 K/uL   RBC 4.29 3.87 - 5.11 MIL/uL   Hemoglobin 13.0 12.0 - 15.0 g/dL   HCT 11/13/20 86.5 - 78.4 %   MCV 88.8 80.0 - 100.0 fL   MCH 30.3 26.0 - 34.0 pg   MCHC 34.1 30.0 - 36.0 g/dL   RDW 69.6 29.5 - 28.4 %   Platelets 130 (L) 150 - 400 K/uL   nRBC 0.0 0.0 - 0.2 %  Resp Panel by RT-PCR (Flu A&B, Covid) Nasopharyngeal Swab   Collection Time: 09/12/20  8:49 PM   Specimen: Nasopharyngeal Swab; Nasopharyngeal(NP) swabs in vial transport medium  Result Value Ref Range   SARS Coronavirus 2 by RT PCR  POSITIVE (A) NEGATIVE   Influenza A by PCR NEGATIVE NEGATIVE   Influenza B by PCR NEGATIVE NEGATIVE    Patient Active Problem List   Diagnosis Date Noted   Indication for care in labor or delivery 09/12/2020   Proteinuria affecting pregnancy 07/09/2020   Supervision of high-risk pregnancy 03/12/2020    Assessment/Plan:  Arnella Pralle is a 25 y.o. G2P1001 at [redacted]w[redacted]d here for latent labor.   #Labor: Pt has remained relatively unchanged via cervical check.  She is contracting q4 min. Expectantly manage. Possibly send home if checks remain unchanged. #Pain: PRN #FWB: cat1 #ID: GBS Pos-PCN #MOF: breast #MOC: Nexplanon #COVID: Pt had a fever, has been given tylenol 650 and receiving fluids. No other symptoms at this time.   Alfredo Martinez, MD, PGY1 [facility] [employer], [facility] 09/13/2020, 12:59 AM  Attestation:  I confirm that I have verified the information documented in the resident's note and that I have also personally reperformed the physical exam and all medical decision making activities.  The patient was seen and examined by me also Agree with note NST reactive and reassuring UCs as listed Cervical exams as listed in note  Aviva Signs, CNM

## 2020-09-13 NOTE — Anesthesia Procedure Notes (Signed)
Epidural Patient location during procedure: OB Start time: 09/13/2020 4:45 AM End time: 09/13/2020 4:55 AM  Staffing Anesthesiologist: Elmer Picker, MD Performed: anesthesiologist   Preanesthetic Checklist Completed: patient identified, IV checked, risks and benefits discussed, monitors and equipment checked, pre-op evaluation and timeout performed  Epidural Patient position: sitting Prep: DuraPrep and site prepped and draped Patient monitoring: continuous pulse ox, blood pressure, heart rate and cardiac monitor Approach: midline Location: L3-L4 Injection technique: LOR air  Needle:  Needle type: Tuohy  Needle gauge: 17 G Needle length: 9 cm Needle insertion depth: 6 cm Catheter type: closed end flexible Catheter size: 19 Gauge Catheter at skin depth: 11 cm Test dose: negative  Assessment Sensory level: T8 Events: blood not aspirated, injection not painful, no injection resistance, no paresthesia and negative IV test  Additional Notes Patient identified. Risks/Benefits/Options discussed with patient including but not limited to bleeding, infection, nerve damage, paralysis, failed block, incomplete pain control, headache, blood pressure changes, nausea, vomiting, reactions to medication both or allergic, itching and postpartum back pain. Confirmed with bedside nurse the patient's most recent platelet count. Confirmed with patient that they are not currently taking any anticoagulation, have any bleeding history or any family history of bleeding disorders. Patient expressed understanding and wished to proceed. All questions were answered. Sterile technique was used throughout the entire procedure. Please see nursing notes for vital signs. Test dose was given through epidural catheter and negative prior to continuing to dose epidural or start infusion. Warning signs of high block given to the patient including shortness of breath, tingling/numbness in hands, complete motor block, or  any concerning symptoms with instructions to call for help. Patient was given instructions on fall risk and not to get out of bed. All questions and concerns addressed with instructions to call with any issues or inadequate analgesia.  Reason for block:procedure for pain

## 2020-09-14 ENCOUNTER — Encounter (HOSPITAL_COMMUNITY): Payer: Self-pay | Admitting: Obstetrics & Gynecology

## 2020-09-14 LAB — RPR: RPR Ser Ql: NONREACTIVE

## 2020-09-14 NOTE — Lactation Note (Signed)
This note was copied from a baby's chart. Lactation Consultation Note  Patient Name: Girl Tiffanny Lamarche EHMCN'O Date: 09/14/2020 Reason for consult: Initial assessment;Early term 37-38.6wks Age:24 hours, ETI female infant, infant had 3 voids and 2 stools since birth. Per mom, some feedings are 1 hour in length. LC discussed with mom due infant being ETI and less than 6 lbs limit total feeding to 30 minutes or less to infant's energy. Mom will BF infant according to feeding cues, not make infant wait to BF. LC discussed infant's input and output. Mom latched infant on her left breast using cradle hold, infant was on and off breast at first, [facility] assisted mom with infant sustaining latch by adjusting feeding postion. Infant was still BF after 12 minutes when LC left the room. Mom will offer infant her EBM that she pumped  ( 9 mls) after she finishes BF infant. Mom prefers to use a spoon and not bottle nipple at this time.  Mom shown how to use DEBP & how to disassemble, clean, & reassemble parts.  Mom made aware of O/P services, breastfeeding support groups, community resources, and our phone # for post-discharge questions.   Mom's plan: 1- Mom will BF infant according to cues, 8 to 12 or more times within 24 hours, STS. 2- Mom will continue to work on latching infant at the breast, limiting total feeding to 30 minutes to reserve infant's energy. 3- Mom will supplement infant with her EBM  that she pumped after latching infant at the breast.  4- Mom will use DEBP every 3 hours for 15 minutes on initial setting. 5- Mom will call RN or LC if she has BF questions, concerns or need assistance with latching infant at the breast.  Maternal Data Has patient been taught Hand Expression?: Yes Does the patient have breastfeeding experience prior to this delivery?: Yes How long did the patient breastfeed?: Per mom, she BF her 24 year old daughter for 2 years  Feeding Mother's Current Feeding Choice: Breast  Milk  LATCH Score Latch: Repeated attempts needed to sustain latch, nipple held in mouth throughout feeding, stimulation needed to elicit sucking reflex.  Audible Swallowing: A few with stimulation  Type of Nipple: Everted at rest and after stimulation  Comfort (Breast/Nipple): Soft / non-tender  Hold (Positioning): Assistance needed to correctly position infant at breast and maintain latch.  LATCH Score: 7   Lactation Tools Discussed/Used Tools: Pump Breast pump type: Double-Electric Breast Pump Pump Education: Setup, frequency, and cleaning Reason for Pumping: Infant is less than 6 lbs and ETI infant was little sleepy earlier Pumping frequency: LC briefly demonstrated how to use DEBP and mom easily pumped 9 mls of colostrum , mom will pump every 3 hours for 15 minutes on inital setting. Pumped volume: 9 mL  Interventions Interventions: Breast feeding basics reviewed;Assisted with latch;Skin to skin;Hand express;Breast compression;Adjust position;Support pillows;Position options;Expressed milk;DEBP;Education  Discharge Pump: DEBP;Personal WIC Program: Yes  Consult Status Consult Status: Follow-up Date: 09/14/20 Follow-up type: In-patient    Danelle Earthly 09/14/2020, 12:01 AM

## 2020-09-14 NOTE — Anesthesia Postprocedure Evaluation (Signed)
Anesthesia Post Note  Patient: Sara Wilkinson  Procedure(s) Performed: AN AD HOC LABOR EPIDURAL     Patient location during evaluation: Mother Baby Anesthesia Type: Epidural Level of consciousness: awake Pain management: satisfactory to patient Vital Signs Assessment: post-procedure vital signs reviewed and stable Respiratory status: spontaneous breathing Cardiovascular status: stable Anesthetic complications: no   No notable events documented.  Last Vitals:  Vitals:   09/13/20 2033 09/14/20 0641  BP: 133/79 118/63  Pulse: (!) 58 (!) 52  Resp: 18 18  Temp: 36.4 C 36.5 C  SpO2: 100%     Last Pain:  Vitals:   09/14/20 0729  TempSrc:   PainSc: Asleep   Pain Goal: Patients Stated Pain Goal: 1 (09/13/20 0432)                 Cephus Shelling

## 2020-09-14 NOTE — Progress Notes (Signed)
Post Partum Day 1  Subjective: Sara Wilkinson is doing well this morning. She reports mild cramping, controlled with motrin and tylenol. She reports she did not sleep well last night because the baby was up all night. She otherwise has no concerns. She is up ad lib, voiding, tolerating PO, and + flatus  Objective: Blood pressure 118/63, pulse (!) 52, temperature 97.7 F (36.5 C), temperature source Oral, resp. rate 18, height 5\' 8"  (1.727 m), weight 92.1 kg, last menstrual period 12/20/2019, SpO2 100 %, unknown if currently breastfeeding.  Physical Exam:  General: alert, cooperative, and no distress Lochia: appropriate Uterine Fundus: firm Incision: n/a DVT Evaluation: No evidence of DVT seen on physical exam.  Recent Labs    09/12/20 2048  HGB 13.0  HCT 38.1    Assessment/Plan: We discussed discharge today vs tomorrow and patient opts for discharge tomorrow.  I offered to place Nexplanon today given that I am available, however she declines and opts for placement tomorrow.      LOS: 2 days   11/13/20 09/14/2020, 12:19 PM

## 2020-09-14 NOTE — Lactation Note (Signed)
This note was copied from a baby's chart. Lactation Consultation Note  Patient Name: Sara Wilkinson VOZDG'U Date: 09/14/2020 Reason for consult: Follow-up assessment;Early term 37-38.6wks;Infant < 6lbs Age:24 hours Mom asleep in bed, baby swaddled and asleep on pillow bedside mom, dad asleep on couch.   LC woke mom and placed baby on back in bassinet, mom agreed to put on mask. Mom reports breastfeeding is going well, denies pain at breast, or damaged nipples, reports keeping feedings to less than , uses hand pump and gives any EBM back to baby. Mom denies with any COVID symptoms, states dad is sick. Discussed safe sleep. Reinforced parents to wear mask when holding/ near baby, mom's breastmilk is safe for baby. Reinforced cue based feedings, expect 8-12 feedings a day, call for [facility] support if needed prior to next visit. Mom voiced understanding and with no further concerns, states needs to use the bathroom, denies needing assistance. Left the room with baby asleep on back in bassinet, dad asleep in chair, and mom up to the bathroom. BGilliam, RN, IBCLC    Feeding Mother's Current Feeding Choice: Breast Milk    Interventions Interventions: Breast feeding basics reviewed;Education   Consult Status Consult Status: Follow-up Date: 09/15/20 Follow-up type: In-patient    Charlynn Court 09/14/2020, 5:02 PM

## 2020-09-15 DIAGNOSIS — Z30017 Encounter for initial prescription of implantable subdermal contraceptive: Secondary | ICD-10-CM

## 2020-09-15 MED ORDER — COCONUT OIL OIL: 1.0000 | TOPICAL_OIL | 0 refills | Status: DC | PRN

## 2020-09-15 MED ORDER — LIDOCAINE HCL 1 % IJ SOLN
0.0000 mL | Freq: Once | INTRAMUSCULAR | Status: AC | PRN
  Administered 2020-09-15: 20 mL via INTRADERMAL
  Filled 2020-09-15: qty 20

## 2020-09-15 MED ORDER — IBUPROFEN 600 MG PO TABS: 600.0000 mg | ORAL_TABLET | Freq: Four times a day (QID) | ORAL | 0 refills | Status: DC

## 2020-09-15 MED ORDER — ETONOGESTREL 68 MG ~~LOC~~ IMPL
68.0000 mg | DRUG_IMPLANT | Freq: Once | SUBCUTANEOUS | Status: AC
  Administered 2020-09-15: 68 mg via SUBCUTANEOUS
  Filled 2020-09-15: qty 1

## 2020-09-15 MED ORDER — ACETAMINOPHEN 325 MG PO TABS: 650.0000 mg | ORAL_TABLET | ORAL | Status: DC | PRN

## 2020-09-15 MED ORDER — PRENATAL MULTIVITAMIN CH: 1.0000 | ORAL_TABLET | Freq: Every day | ORAL | Status: DC

## 2020-09-15 MED ORDER — BENZOCAINE-MENTHOL 20-0.5 % EX AERO: 1.0000 | INHALATION_SPRAY | CUTANEOUS | Status: DC | PRN

## 2020-09-15 NOTE — Lactation Note (Signed)
This note was copied from a baby's chart. Lactation Consultation Note  Patient Name: Girl Haylo Fake IPJAS'N Date: 09/15/2020 Reason for consult: Follow-up assessment;Early term 37-38.6wks Age:24 hours  LC in to room for follow up. Infant is crying and seems hungry. Father reports feeding infant EBM with syringe. Mother reports discomfort with pumping. LC fitted mother with 27-mm flanges, and encouraged using coconut oil prior to start pumping. LC assisted with latch reviewing positioning, alignment. Demonstrated deep, asymmetrical latch as well as ways to keep infant awake at breast. Discussed normal behavior and patterns when clusterfeeding. Talked about milk coming into volume. Reinforced offering both breasts and then supplementing with EBM. Mother's milk seems transitional and she reports breast feeling heavier.    Feeding plan:  1-Skin to skin 2-Aim for a deep, comfortable latch 3-Breastfeeding on demand or 8-12 times in 24h period. 4-Keep infant awake during breastfeeding session: massaging breast, infant's hand/shoulder/feet 5-Pump or hand-express and offer EBM after breastfeeding to "top infant up". 6-Monitor voids and stools as signs good intake.  7-Encouraged maternal rest, hydration and food intake.  8-Contact LC as needed for feeds/support/concerns/questions    All questions answered at this time. Reviewed LC brochure and [employer] booklet.     Maternal Data Has patient been taught Hand Expression?: Yes  Feeding Mother's Current Feeding Choice: Breast Milk  LATCH Score Latch: Grasps breast easily, tongue down, lips flanged, rhythmical sucking.  Audible Swallowing: Spontaneous and intermittent  Type of Nipple: Everted at rest and after stimulation  Comfort (Breast/Nipple): Soft / non-tender  Hold (Positioning): Assistance needed to correctly position infant at breast and maintain latch.  LATCH Score: 9   Lactation Tools Discussed/Used Tools: Pump;Flanges;Coconut  oil Flange Size: 27 (fitted for larger size) Breast pump type: Double-Electric Breast Pump;Manual Pump Education: Milk Storage;Setup, frequency, and cleaning Reason for Pumping: supplementation Pumping frequency: after feedings Pumped volume: 6 mL (with hand pump only)  Interventions Interventions: Assisted with latch;Skin to skin;Breast massage;Hand express;Adjust position;DEBP;Hand pump;Expressed milk;Position options;Education;Support pillows  Discharge Discharge Education: Engorgement and breast care;Warning signs for feeding baby Pump: Manual;DEBP  Consult Status Consult Status: Complete Date: 09/15/20 Follow-up type: Call as needed    Vrishank Moster A Higuera Ancidey 09/15/2020, 1:11 PM

## 2020-09-15 NOTE — Procedures (Signed)
     PROCEDURE NOTE  Sara Wilkinson is a 25 y.o. [phone] requesting Nexplanon insertion prior to hospital discharge s/p vaginal delivery.    Nexplanon Insertion Procedure Patient identified, informed consent performed, consent signed.   Patient does understand that irregular bleeding is a very common side effect of this medication. She was advised to have backup contraception for one week after placement. Pregnancy test in clinic today was negative.  Appropriate time out taken.  Patient's left arm was prepped and draped in the usual sterile fashion. The ruler used to measure and mark insertion area.  Patient was prepped with alcohol swab and then injected with 3 ml of 1% lidocaine.  She was prepped with betadine, Nexplanon removed from packaging,  Device confirmed in needle, then inserted full length of needle and withdrawn per handbook instructions. Nexplanon was able to palpated in the patient's arm; patient palpated the insert herself. There was minimal blood loss.  Patient insertion site covered with guaze and a pressure bandage to reduce any bruising.  The patient tolerated the procedure well and was given post procedure instructions.   Clayton Bibles, MSN, CNM Certified Nurse Midwife, [employer] for [employer], [facility] 09/15/20 10:40 AM

## 2020-09-25 ENCOUNTER — Encounter: Payer: Medicaid Other | Admitting: Obstetrics and Gynecology

## 2020-09-26 ENCOUNTER — Telehealth (HOSPITAL_COMMUNITY): Payer: Self-pay | Admitting: *Deleted

## 2020-09-26 NOTE — Telephone Encounter (Signed)
Busy signal with phone call. Unable to leave message.  Duffy Rhody, RN 09/26/2020 at 9:20am

## 2020-10-23 ENCOUNTER — Encounter: Payer: Self-pay | Admitting: Obstetrics and Gynecology

## 2020-10-23 ENCOUNTER — Other Ambulatory Visit: Payer: Self-pay

## 2020-10-23 ENCOUNTER — Telehealth (INDEPENDENT_AMBULATORY_CARE_PROVIDER_SITE_OTHER): Payer: Medicaid Other | Admitting: Obstetrics and Gynecology

## 2020-10-23 DIAGNOSIS — Z3009 Encounter for other general counseling and advice on contraception: Secondary | ICD-10-CM

## 2020-10-23 DIAGNOSIS — Z3040 Encounter for surveillance of contraceptives, unspecified: Secondary | ICD-10-CM

## 2020-10-23 NOTE — Progress Notes (Signed)
Provider location: [facility] for [facility]   Patient location: Home  I connected with Sara Wilkinson on 10/25/20 at  3:10 PM EDT by Mychart Video Encounter and verified that I am speaking with the correct person using two identifiers.       I discussed the limitations, risks, security and privacy concerns of performing an evaluation and management service virtually and the availability of in person appointments. I also discussed with the patient that there may be a patient responsible charge related to this service. The patient expressed understanding and agreed to proceed.  Post Partum Visit Note Subjective:   Sara Wilkinson is a 25 y.o. [phone] female who presents for a postpartum visit. She is 6 weeks postpartum following a normal spontaneous vaginal delivery.  I have fully reviewed the prenatal and intrapartum course. The delivery was at 38.2 gestational weeks.  Anesthesia: epidural. Postpartum course has been uncomplicated. Baby [place] is doing well; gaining weight appropriately. Baby is feeding by breast. Bleeding no bleeding. Bowel function is normal. Bladder function is normal. Patient is not sexually active. Contraception method is Nexplanon. Postpartum depression screening: negative.   The pregnancy intention screening data noted above was reviewed. Potential methods of contraception were discussed. The patient elected to proceed with Nexplanon insertion prior to d/c from hospital.   Inocente Salles Postnatal Depression Scale - 10/23/20 1515       Edinburgh Postnatal Depression Scale:  In the Past 7 Days   I have been able to laugh and see the funny side of things. 0    I have looked forward with enjoyment to things. 0    I have blamed myself unnecessarily when things went wrong. 0    I have been anxious or worried for no good reason. 0    I have felt scared or panicky for no good reason. 0    Things have been getting on top of me. 0    I have been so  unhappy that I have had difficulty sleeping. 0    I have felt sad or miserable. 0    I have been so unhappy that I have been crying. 0    The thought of harming myself has occurred to me. 0    Edinburgh Postnatal Depression Scale Total 0             The following portions of the patient's history were reviewed and updated as appropriate: allergies, current medications, past family history, past medical history, past social history, past surgical history, and problem list.  Review of Systems Constitutional: negative Eyes: negative Ears, nose, mouth, throat, and face: negative Respiratory: negative Cardiovascular: negative Gastrointestinal: negative Genitourinary:negative Integument/breast: negative Hematologic/lymphatic: negative Musculoskeletal:negative Neurological: negative Behavioral/Psych: negative Endocrine: negative Allergic/Immunologic: negative  Objective:  BP 124/68   Wt 178 lb 12.8 oz (81.1 kg)   Breastfeeding Yes   BMI 27.19 kg/m     General:  Alert, oriented and cooperative. Patient is in no acute distress.  Respiratory: Normal respiratory effort, no problems with respiration noted  Mental Status: Normal mood and affect. Normal behavior. Normal judgment and thought content.  Rest of physical exam deferred due to type of encounter   Assessment:  Encounter for postpartum care of lactating mother - Normal postpartum exam.  Encounter for surveillance of contraceptive device  - Nexplanon  Plan:  Essential components of care per ACOG recommendations:  1.  Mood and well being: Patient with negative depression screening today. Reviewed local resources for support.  -  Patient does not use tobacco. If using tobacco we discussed reduction and for recently cessation risk of relapse - hx of drug use? No   If yes, discussed support systems  2. Infant care and feeding:  -Patient currently breastmilk feeding? Yes If breastmilk feeding discussed return to work and  pumping. If needed, patient was provided letter for work to allow for every 2-3 hr pumping breaks, and to be granted a private location to express breastmilk and refrigerated area to store breastmilk. Reviewed importance of draining breast regularly to support lactation. -Social determinants of health (SDOH) reviewed in EPIC. No concerns  3. Sexuality, contraception and birth spacing - Patient does not want a pregnancy in the next year.  Desired family size is unsure of number of children.  - Reviewed forms of contraception in tiered fashion. Patient desired  Nexplanon  today.   - Discussed birth spacing of 18 months  4. Sleep and fatigue -Encouraged family/partner/community support of 4 hrs of uninterrupted sleep to help with mood and fatigue  5. Physical Recovery  - Discussed patients delivery and complications - Patient had bilateral labial lacerations, perineal healing reviewed. Patient expressed understanding - Patient has urinary incontinence? No - Patient is safe to resume physical and sexual activity  6.  Health Maintenance - Last pap smear done 03/12/2020 and was normal with negative HPV. Mammogram n/a  7. Chronic Disease - PCP follow up for proteinuria - Patient will call to scheduled F/U with PCP  I provided 10 minutes of face-to-face time during this encounter. There was 5 minutes of chart review time spent prior to this encounter. Total time spent = 15 minutes.     Return in about 1 year (around 10/23/2021) for Annual Exam.  No future appointments.  Raelyn Mora, CNM [facility] for [employer], [facility]

## 2020-10-25 ENCOUNTER — Encounter: Payer: Self-pay | Admitting: Obstetrics and Gynecology

## 2023-10-06 ENCOUNTER — Ambulatory Visit: Admitting: Certified Nurse Midwife

## 2023-10-06 ENCOUNTER — Encounter: Payer: Self-pay | Admitting: Certified Nurse Midwife

## 2023-10-06 VITALS — BP 125/80 | HR 90 | Ht 68.0 in | Wt 193.9 lb

## 2023-10-06 DIAGNOSIS — Z3046 Encounter for surveillance of implantable subdermal contraceptive: Secondary | ICD-10-CM | POA: Diagnosis not present

## 2023-10-06 MED ORDER — PRENATAL 27-0.8 MG PO TABS: 1.0000 | ORAL_TABLET | Freq: Every day | ORAL | 4 refills | Status: DC

## 2023-10-06 NOTE — Progress Notes (Signed)
     GYNECOLOGY OFFICE PROCEDURE NOTE  Sara Wilkinson is a 28 y.o. [phone] here for Nexplanon  removal.    Last pap smear: Result Date Procedure Results Follow-ups  03/12/2020 Cytology - PAP Neisseria Gonorrhea: Negative Chlamydia: Negative Trichomonas: Negative Adequacy: Satisfactory for evaluation; transformation zone component PRESENT. Diagnosis: - Negative for intraepithelial lesion or malignancy (NILM) Comment: Normal Reference Range Trichomonas - Negative Comment: Normal Reference Ranger Chlamydia - Negative Comment: Normal Reference Range Neisseria Gonorrhea - Negative   10/14/2014 Cytology - PAP Pap: Negative for intraephithelial lesion or malignancy      Nexplanon  removal Procedure Patient identified, informed consent performed, consent signed.   Appropriate time out taken. Nexplanon  site identified in patient's left arm.  Area prepped in usual sterile fashon. One ml of 1% lidocaine  was used to anesthetize the area at the distal end of the implant. A small stab incision was made right beside the implant on the distal portion.  The Nexplanon  rod was grasped using hemostats and removed without difficulty. There was minimal blood loss. There were no complications. Steri-strips were applied over the small incision. A pressure bandage was applied to reduce any bruising. The patient tolerated the procedure well and was given post procedure instructions. Patient declines other forms of birth control.  Prenatal vitamin prescribed, plans to try to conceive.  Harlene LITTIE Cisco, CNM

## 2024-02-06 ENCOUNTER — Encounter

## 2024-02-06 NOTE — Progress Notes (Signed)
 New OB Intake  I connected with  Sara Wilkinson on 02/06/24 at 10:15 AM EST by MyChart Video Visit and verified that I am speaking with the correct person using two identifiers. Nurse is located at [facility] and pt is located at Home.  I discussed the limitations, risks, security and privacy concerns of performing an evaluation and management service by telephone and the availability of in person appointments. I also discussed with the patient that there may be a patient responsible charge related to this service. The patient expressed understanding and agreed to proceed.  I explained I am completing New OB Intake today. We discussed her EDD of 09/24/24 that is based on LMP of 12/19/23. Pt is G3/P2002. I reviewed her allergies, medications, Medical/Surgical/OB history, and appropriate screenings. There are cats in the home: yes. If yes: Outdoor. Based on history, this is a/an pregnancy complicated by hypertension . Her obstetrical history is significant for Hypertension with 2nd pregnancy, at home monitoring but no medication, no induction, no dx of pre-eclampsia.  Patient Active Problem List   Diagnosis Date Noted   Nexplanon  insertion 09/15/2020   Indication for care in labor or delivery 09/12/2020   Proteinuria affecting pregnancy 07/09/2020   Supervision of high-risk pregnancy 03/12/2020    Concerns addressed today: No concerns today.  Delivery Plans:  Plans to deliver at [facility] in [place].   Mathis LITTIE Getting, CMA 02/06/2024  10:31 AM  This encounter was created in error - please disregard.

## 2024-02-20 ENCOUNTER — Ambulatory Visit (INDEPENDENT_AMBULATORY_CARE_PROVIDER_SITE_OTHER): Admitting: *Deleted

## 2024-02-20 ENCOUNTER — Other Ambulatory Visit (INDEPENDENT_AMBULATORY_CARE_PROVIDER_SITE_OTHER): Payer: Self-pay

## 2024-02-20 VITALS — BP 123/86 | HR 92 | Wt 188.2 lb

## 2024-02-20 DIAGNOSIS — Z3480 Encounter for supervision of other normal pregnancy, unspecified trimester: Secondary | ICD-10-CM

## 2024-02-20 DIAGNOSIS — O219 Vomiting of pregnancy, unspecified: Secondary | ICD-10-CM

## 2024-02-20 DIAGNOSIS — Z3A09 9 weeks gestation of pregnancy: Secondary | ICD-10-CM | POA: Diagnosis not present

## 2024-02-20 DIAGNOSIS — Z3481 Encounter for supervision of other normal pregnancy, first trimester: Secondary | ICD-10-CM

## 2024-02-20 DIAGNOSIS — O3680X0 Pregnancy with inconclusive fetal viability, not applicable or unspecified: Secondary | ICD-10-CM

## 2024-02-20 MED ORDER — BLOOD PRESSURE KIT DEVI: 1.0000 | 0 refills | Status: AC

## 2024-02-20 MED ORDER — PREPLUS 27-1 MG PO TABS: 1.0000 | ORAL_TABLET | Freq: Every day | ORAL | 13 refills | Status: AC

## 2024-02-20 MED ORDER — DOXYLAMINE-PYRIDOXINE 10-10 MG PO TBEC: 2.0000 | DELAYED_RELEASE_TABLET | Freq: Every day | ORAL | 5 refills | Status: AC

## 2024-02-20 MED ORDER — PROMETHAZINE HCL 25 MG PO TABS: 25.0000 mg | ORAL_TABLET | Freq: Four times a day (QID) | ORAL | 1 refills | Status: AC | PRN

## 2024-02-20 NOTE — Progress Notes (Signed)
 New OB Intake  I connected with Sara Wilkinson  on 02/20/2024 at  2:10 PM EST by In Person Visit and verified that I am speaking with the correct person using two identifiers. Nurse is located at [facility] and pt is located at [place].  I discussed the limitations, risks, security and privacy concerns of performing an evaluation and management service by telephone and the availability of in person appointments. I also discussed with the patient that there may be a patient responsible charge related to this service. The patient expressed understanding and agreed to proceed.  I explained I am completing New OB Intake today. We discussed EDD of 09/24/24 based on LMP of 12/19/23. Pt is G3P2002. I reviewed her allergies, medications and Medical/Surgical/OB history.    Patient Active Problem List   Diagnosis Date Noted   Nexplanon  insertion 09/15/2020   Indication for care in labor or delivery 09/12/2020   Proteinuria affecting pregnancy 07/09/2020   Supervision of high-risk pregnancy 03/12/2020     Concerns addressed today  Delivery Plans Plans to deliver at [facility]. Discussed the nature of our practice with multiple providers including residents and students as well as female and female providers. Due to the size of the practice, the delivering provider may not be the same as those providing prenatal care.   Patient is not interested in water birth.  MyChart/Babyscripts MyChart access verified. I explained pt will have some visits in office and some virtually. Babyscripts instructions given and order placed. Patient verifies receipt of registration text/e-mail. Account successfully created and app downloaded. If patient is a candidate for Optimized scheduling, add to sticky note.   Blood Pressure Cuff/Weight Scale Blood pressure cuff ordered for patient to pick-up from [employer]. Explained after first prenatal appt pt will check weekly and document in Babyscripts. Patient does not have  weight scale; patient may purchase if they desire to track weight weekly in Babyscripts.  Anatomy US  Explained first scheduled US  will be around 19 weeks. Anatomy US  scheduled for TBD at [facility]BD.  Is patient a candidate for Babyscripts Optimization? No, due to Risk Factors.   First visit review I reviewed new OB appt with patient. Explained pt will be seen by Nidia Daring, NP at first visit. Discussed [facility] genetic screening with patient. Requests Panorama. Routine prenatal labs OB Urine collected at today's visit.   Last Pap Diagnosis  Date Value Ref Range Status  03/12/2020   Final   - Negative for intraepithelial lesion or malignancy (NILM)    Rocky CHRISTELLA Ober, RN 02/20/2024  3:20 PM

## 2024-02-20 NOTE — Patient Instructions (Signed)

## 2024-02-22 LAB — URINE CULTURE, OB REFLEX

## 2024-02-22 LAB — CULTURE, OB URINE

## 2024-02-27 ENCOUNTER — Other Ambulatory Visit

## 2024-03-14 ENCOUNTER — Ambulatory Visit (INDEPENDENT_AMBULATORY_CARE_PROVIDER_SITE_OTHER): Payer: Self-pay | Admitting: Obstetrics and Gynecology

## 2024-03-14 ENCOUNTER — Encounter: Payer: Self-pay | Admitting: Obstetrics and Gynecology

## 2024-03-14 ENCOUNTER — Other Ambulatory Visit (HOSPITAL_COMMUNITY)
Admission: RE | Admit: 2024-03-14 | Discharge: 2024-03-14 | Disposition: A | Discharge status: Home / Self Care | Source: Ambulatory Visit | Attending: Obstetrics and Gynecology | Admitting: Obstetrics and Gynecology

## 2024-03-14 VITALS — Wt 183.8 lb

## 2024-03-14 DIAGNOSIS — Z3481 Encounter for supervision of other normal pregnancy, first trimester: Secondary | ICD-10-CM | POA: Diagnosis not present

## 2024-03-14 DIAGNOSIS — Z3480 Encounter for supervision of other normal pregnancy, unspecified trimester: Secondary | ICD-10-CM | POA: Diagnosis present

## 2024-03-14 DIAGNOSIS — Z23 Encounter for immunization: Secondary | ICD-10-CM | POA: Diagnosis not present

## 2024-03-14 DIAGNOSIS — Z3A12 12 weeks gestation of pregnancy: Secondary | ICD-10-CM | POA: Insufficient documentation

## 2024-03-14 NOTE — Patient Instructions (Addendum)
 [address], [place], [place] [address] For blood pressure cuff   MFM ultrasound : [phone] [address] [place] [address]

## 2024-03-14 NOTE — Progress Notes (Signed)
 "  INITIAL PRENATAL VISIT  Subjective:   Sara Wilkinson is being seen today for her first obstetrical visit.  She is at [redacted]w[redacted]d gestation by LMP Her obstetrical history is significant for none. Relationship with FOB: significant other, living together. Patient does intend to breast feed. Pregnancy history fully reviewed.  Patient reports headaches, causing pain in eye, sensitive to light   Pap smear history: 03/12/2020   Objective:    Obstetric History OB History  Gravida Para Term Preterm AB Living  3 2 2  0 0 2  SAB IAB Ectopic Multiple Live Births  0 0 0 0 2    # Outcome Date GA Lbr Len/2nd Weight Sex Type Anes PTL Lv  3 Current           2 Term 09/13/20 [redacted]w[redacted]d  5 lb 12.8 oz (2.63 kg) F Vag-Spont EPI  LIV  1 Term 05/02/15 [redacted]w[redacted]d 70:31 / 00:44 6 lb 8.1 oz (2.951 kg) F Vag-Spont EPI  LIV    Past Medical History:  Diagnosis Date   Asthma childhood   Pt has used inhaler in past/ cannot recall   Asthma childhood   Pt still keeps inhaler available   Asthma childhood   In case needed   Asthma childhood   Childhood asthma   Chlamydia     Past Surgical History:  Procedure Laterality Date   NO PAST SURGERIES     WISDOM TOOTH EXTRACTION Bilateral     Medications Ordered Prior to Encounter[1]  Allergies[2]  Social History:  reports that she has never smoked. She has never used smokeless tobacco. She reports that she does not drink alcohol and does not use drugs.  History reviewed. No pertinent family history.  The following portions of the patient's history were reviewed and updated as appropriate: allergies, current medications, past family history, past medical history, past social history, past surgical history and problem list.  Review of Systems Review of Systems  All other systems reviewed and are negative.   Physical Exam:  Wt 183 lb 12.8 oz (83.4 kg)   LMP 12/19/2023   BMI 27.95 kg/m  CONSTITUTIONAL: Well-developed, well-nourished female in no acute  distress.  HENT:  Normocephalic, atraumatic.   SKIN: Skin is warm and dry. MUSCULOSKELETAL: Normal range of motion NEUROLOGIC: Alert and oriented  PSYCHIATRIC: Normal mood and affect. Normal behavior.  RESPIRATORY: normal effort ABDOMEN: Soft PELVIC:Pelvic: normal appearing vulva with no masses, tenderness or lesions  VAGINA: normal appearing vagina with normal color and discharge, no lesions  CERVIX: normal appearing cervix without discharge or lesions, no CMT  Thin prep pap is done   Extremities:  No swelling or varicosities noted   Fetal Heart Rate (bpm): 162   Movement: Present       Assessment:    Pregnancy: G3P2002  1. Supervision of other normal pregnancy, antepartum (Primary) BP and FHR normal  Pap today  - Cytology - PAP( [place]) - Cervicovaginal ancillary only( [place]) - CBC/D/Plt+RPR+Rh+ABO+RubIgG... - HgB A1c - Comp Met ([facility]) - PANORAMA PRENATAL TEST - Flu vaccine trivalent PF, 6mos and older(Flulaval,Afluria,Fluarix,Fluzone)  2. [redacted] weeks gestation of pregnancy Flu shot today Supportive measures discussed regarding headaches, she has not tried treatment yet,encouraged hydration,  discussed tylenol , excedrine tension, mag oxide 400mg  nightly Picking up BP cuff from pharmacy     Plan:     Initial labs drawn. Prenatal vitamins. Problem list reviewed and updated. Reviewed in detail the nature of the practice with collaborative care between  [employer]  screening discussed: NIPS/First trimester screen/Quad/AFP ordered. Role of ultrasound in pregnancy discussed; Anatomy US : ordered. Discussed clinic routines, schedule of care and testing, genetic screening options, involvement of students and residents under the direct supervision of APPs and doctors and presence of female providers. Pt verbalized understanding.   Sara Nidia CROME, FNP        [1]  Current Outpatient Medications on File Prior to Visit  Medication Sig Dispense Refill   Blood  Pressure Monitoring (BLOOD PRESSURE KIT) DEVI 1 Device by Does not apply route once a week. 1 each 0   Doxylamine -Pyridoxine  (DICLEGIS ) 10-10 MG TBEC Take 2 tablets by mouth at bedtime. If symptoms persist, add one tablet in the morning and one in the afternoon 100 tablet 5   Prenatal Vit-Fe Fumarate-FA (PREPLUS) 27-1 MG TABS Take 1 tablet by mouth daily. 30 tablet 13   promethazine  (PHENERGAN ) 25 MG tablet Take 1 tablet (25 mg total) by mouth every 6 (six) hours as needed for nausea or vomiting. 30 tablet 1   No current facility-administered medications on file prior to visit.  [2]  Allergies Allergen Reactions   Codeine Hives    Shakes, hives   "

## 2024-03-14 NOTE — Progress Notes (Signed)
 New OB  Pt reports migraines. She states she is not currently taking anything. Safe med list given to pt. Reports right eye pain with migraines. Denies vision changes or dizziness.

## 2024-03-15 ENCOUNTER — Ambulatory Visit: Payer: Self-pay | Admitting: Obstetrics and Gynecology

## 2024-03-15 DIAGNOSIS — Z3480 Encounter for supervision of other normal pregnancy, unspecified trimester: Secondary | ICD-10-CM

## 2024-03-15 LAB — COMPREHENSIVE METABOLIC PANEL WITH GFR
ALT: 11 IU/L (ref 0–32)
AST: 13 IU/L (ref 0–40)
Albumin: 4.1 g/dL (ref 4.0–5.0)
Alkaline Phosphatase: 93 IU/L (ref 41–116)
BUN/Creatinine Ratio: 6 — ABNORMAL LOW (ref 9–23)
BUN: 3 mg/dL — ABNORMAL LOW (ref 6–20)
Bilirubin Total: 0.3 mg/dL (ref 0.0–1.2)
CO2: 20 mmol/L (ref 20–29)
Calcium: 9.1 mg/dL (ref 8.7–10.2)
Chloride: 101 mmol/L (ref 96–106)
Creatinine, Ser: 0.48 mg/dL — ABNORMAL LOW (ref 0.57–1.00)
Globulin, Total: 3.1 g/dL (ref 1.5–4.5)
Glucose: 77 mg/dL (ref 70–99)
Potassium: 3.5 mmol/L (ref 3.5–5.2)
Sodium: 137 mmol/L (ref 134–144)
Total Protein: 7.2 g/dL (ref 6.0–8.5)
eGFR: 133 mL/min/1.73

## 2024-03-15 LAB — CBC/D/PLT+RPR+RH+ABO+RUBIGG...
Antibody Screen: NEGATIVE
Basophils Absolute: 0 x10E3/uL (ref 0.0–0.2)
Basos: 0 %
EOS (ABSOLUTE): 0.1 x10E3/uL (ref 0.0–0.4)
Eos: 1 %
HCV Ab: NONREACTIVE
HIV Screen 4th Generation wRfx: NONREACTIVE
Hematocrit: 40.1 % (ref 34.0–46.6)
Hemoglobin: 13.8 g/dL (ref 11.1–15.9)
Hepatitis B Surface Ag: NEGATIVE
Immature Grans (Abs): 0 x10E3/uL (ref 0.0–0.1)
Immature Granulocytes: 0 %
Lymphocytes Absolute: 2.7 x10E3/uL (ref 0.7–3.1)
Lymphs: 29 %
MCH: 30.1 pg (ref 26.6–33.0)
MCHC: 34.4 g/dL (ref 31.5–35.7)
MCV: 88 fL (ref 79–97)
Monocytes Absolute: 0.5 x10E3/uL (ref 0.1–0.9)
Monocytes: 5 %
Neutrophils Absolute: 5.9 x10E3/uL (ref 1.4–7.0)
Neutrophils: 65 %
Platelets: 294 x10E3/uL (ref 150–450)
RBC: 4.58 x10E6/uL (ref 3.77–5.28)
RDW: 11.9 % (ref 11.7–15.4)
RPR Ser Ql: NONREACTIVE
Rh Factor: POSITIVE
Rubella Antibodies, IGG: 1.91 {index}
WBC: 9.2 x10E3/uL (ref 3.4–10.8)

## 2024-03-15 LAB — CERVICOVAGINAL ANCILLARY ONLY
Bacterial Vaginitis (gardnerella): NEGATIVE
Candida Glabrata: NEGATIVE
Candida Vaginitis: NEGATIVE
Chlamydia: NEGATIVE
Comment: NEGATIVE
Comment: NEGATIVE
Comment: NEGATIVE
Comment: NEGATIVE
Comment: NEGATIVE
Comment: NORMAL
Neisseria Gonorrhea: NEGATIVE
Trichomonas: NEGATIVE

## 2024-03-15 LAB — HCV INTERPRETATION

## 2024-03-15 LAB — HEMOGLOBIN A1C
Est. average glucose Bld gHb Est-mCnc: 91 mg/dL
Hgb A1c MFr Bld: 4.8 % (ref 4.8–5.6)

## 2024-03-16 LAB — CYTOLOGY - PAP
Comment: NEGATIVE
Diagnosis: NEGATIVE
High risk HPV: NEGATIVE

## 2024-03-19 LAB — PANORAMA PRENATAL TEST FULL PANEL:PANORAMA TEST PLUS 5 ADDITIONAL MICRODELETIONS: FETAL FRACTION: 6.8

## 2024-03-20 ENCOUNTER — Telehealth: Payer: Self-pay

## 2024-03-20 NOTE — Telephone Encounter (Signed)
 Left patient a voicemail to call office about getting her anatomy ultrasound scheduled in our office.

## 2024-04-12 ENCOUNTER — Encounter: Payer: Self-pay | Admitting: Obstetrics and Gynecology

## 2024-04-12 ENCOUNTER — Ambulatory Visit: Payer: Self-pay | Admitting: Obstetrics and Gynecology

## 2024-04-12 VITALS — BP 137/88 | HR 101 | Wt 192.0 lb

## 2024-04-12 DIAGNOSIS — Z3480 Encounter for supervision of other normal pregnancy, unspecified trimester: Secondary | ICD-10-CM

## 2024-04-12 NOTE — Progress Notes (Signed)
" ° °  PRENATAL VISIT NOTE  Subjective:  Sara Wilkinson is a 29 y.o. G3P2002 at [redacted]w[redacted]d being seen today for ongoing prenatal care.  She is currently monitored for the following issues for this low-risk pregnancy and has Supervision of other normal pregnancy, antepartum on their problem list.  Patient reports no complaints.  Contractions: Not present.  .  Movement: Present. Denies leaking of fluid.   The following portions of the patient's history were reviewed and updated as appropriate: allergies, current medications, past family history, past medical history, past social history, past surgical history and problem list.   Objective:   Vitals:   04/12/24 1511  BP: 137/88  Pulse: (!) 101  Weight: 192 lb (87.1 kg)    Fetal Status:  Fetal Heart Rate (bpm): 154   Movement: Present    General: Alert, oriented and cooperative. Patient is in no acute distress.  Skin: Skin is warm and dry. No rash noted.   Cardiovascular: Normal heart rate noted  Respiratory: Normal respiratory effort, no problems with respiration noted  Abdomen: Soft, gravid, appropriate for gestational age.  Pain/Pressure: Absent     Pelvic: Cervical exam deferred        Extremities: Normal range of motion.  Edema: None  Mental Status: Normal mood and affect. Normal behavior. Normal judgment and thought content.      03/14/2024    4:51 PM 02/20/2024    3:13 PM 08/28/2020   10:39 AM  Depression screen PHQ 2/9  Decreased Interest 0 0 0  Down, Depressed, Hopeless 0 0 0  PHQ - 2 Score 0 0 0  Altered sleeping 0 0 1  Tired, decreased energy 0 0 0  Change in appetite 0 0 0  Feeling bad or failure about yourself  0 0 0  Trouble concentrating 0 0 0  Moving slowly or fidgety/restless 0 0 0  Suicidal thoughts 0 0 0  PHQ-9 Score 0 0 1   Difficult doing work/chores   Not difficult at all     Data saved with a previous flowsheet row definition        03/14/2024    4:52 PM 02/20/2024    3:13 PM 08/28/2020   10:40 AM  GAD  7 : Generalized Anxiety Score  Nervous, Anxious, on Edge 0  1  0   Control/stop worrying 0  0  0   Worry too much - different things 0  0  0   Trouble relaxing 0  0  0   Restless 0  0  0   Easily annoyed or irritable 0  0  0   Afraid - awful might happen 0  0  0   Total GAD 7 Score 0 1 0  Anxiety Difficulty   Not difficult at all     Data saved with a previous flowsheet row definition    Assessment and Plan:  Pregnancy: G3P2002 at [redacted]w[redacted]d 1. Supervision of other normal pregnancy, antepartum (Primary) Patient is doing well without complaints AFP declined today  Preterm labor symptoms and general obstetric precautions including but not limited to vaginal bleeding, contractions, leaking of fluid and fetal movement were reviewed in detail with the patient. Please refer to After Visit Summary for other counseling recommendations.   Return in about 4 weeks (around 05/10/2024) for in person, ROB, Low risk.  No future appointments.  Winton Felt, MD  "

## 2024-05-10 ENCOUNTER — Encounter: Payer: Self-pay | Admitting: Physician Assistant

## 2024-05-23 ENCOUNTER — Other Ambulatory Visit
